# Patient Record
Sex: Female | Born: 1970
Health system: Southern US, Community
[De-identification: ages and names within clinical notes are randomized; demographics above are authoritative.]

## PROBLEM LIST (undated history)

## (undated) DIAGNOSIS — J45909 Unspecified asthma, uncomplicated: Secondary | ICD-10-CM

## (undated) DIAGNOSIS — N83209 Unspecified ovarian cyst, unspecified side: Secondary | ICD-10-CM

## (undated) DIAGNOSIS — M1990 Unspecified osteoarthritis, unspecified site: Secondary | ICD-10-CM

## (undated) DIAGNOSIS — N939 Abnormal uterine and vaginal bleeding, unspecified: Secondary | ICD-10-CM

## (undated) HISTORY — PX: HERNIA REPAIR: SHX51

## (undated) HISTORY — PX: OVARIAN CYST REMOVAL: SHX89

## (undated) HISTORY — PX: HAND SURGERY: SHX662

---

## 2003-09-05 ENCOUNTER — Other Ambulatory Visit: Admission: RE | Admit: 2003-09-05 | Discharge: 2003-09-05 | Payer: Self-pay | Admitting: Family Medicine

## 2005-05-16 ENCOUNTER — Other Ambulatory Visit: Admission: RE | Admit: 2005-05-16 | Discharge: 2005-05-16 | Payer: Self-pay | Admitting: Obstetrics and Gynecology

## 2007-03-08 ENCOUNTER — Encounter: Admission: RE | Admit: 2007-03-08 | Discharge: 2007-03-08 | Payer: Self-pay | Admitting: Emergency Medicine

## 2009-10-05 ENCOUNTER — Other Ambulatory Visit: Admission: RE | Admit: 2009-10-05 | Discharge: 2009-10-05 | Payer: Self-pay | Admitting: Family Medicine

## 2010-07-08 ENCOUNTER — Other Ambulatory Visit: Payer: Self-pay | Admitting: Family Medicine

## 2010-07-08 ENCOUNTER — Ambulatory Visit
Admission: RE | Admit: 2010-07-08 | Discharge: 2010-07-08 | Disposition: A | Discharge status: Home / Self Care | Payer: Self-pay | Source: Ambulatory Visit | Attending: Family Medicine | Admitting: Family Medicine

## 2010-07-08 DIAGNOSIS — R509 Fever, unspecified: Secondary | ICD-10-CM

## 2010-07-08 DIAGNOSIS — R0789 Other chest pain: Secondary | ICD-10-CM

## 2010-07-08 DIAGNOSIS — Z87891 Personal history of nicotine dependence: Secondary | ICD-10-CM

## 2010-07-09 ENCOUNTER — Ambulatory Visit
Admission: RE | Admit: 2010-07-09 | Discharge: 2010-07-09 | Disposition: A | Discharge status: Home / Self Care | Payer: BC Managed Care – PPO | Source: Ambulatory Visit | Attending: Family Medicine | Admitting: Family Medicine

## 2010-07-09 ENCOUNTER — Other Ambulatory Visit: Payer: Self-pay | Admitting: Family Medicine

## 2010-07-09 DIAGNOSIS — R1032 Left lower quadrant pain: Secondary | ICD-10-CM

## 2010-07-09 DIAGNOSIS — R509 Fever, unspecified: Secondary | ICD-10-CM

## 2010-07-09 MED ORDER — IOHEXOL 300 MG/ML  SOLN
100.0000 mL | Freq: Once | INTRAMUSCULAR | Status: AC | PRN
  Administered 2010-07-09: 100 mL via INTRAVENOUS

## 2010-07-25 ENCOUNTER — Other Ambulatory Visit: Payer: Self-pay | Admitting: Obstetrics and Gynecology

## 2010-09-03 ENCOUNTER — Encounter (HOSPITAL_COMMUNITY)
Admission: RE | Admit: 2010-09-03 | Discharge: 2010-09-03 | Disposition: A | Discharge status: Home / Self Care | Payer: BC Managed Care – PPO | Source: Ambulatory Visit | Attending: Obstetrics and Gynecology | Admitting: Obstetrics and Gynecology

## 2010-09-03 LAB — CBC
HCT: 36.3 % (ref 36.0–46.0)
Hemoglobin: 11.9 g/dL — ABNORMAL LOW (ref 12.0–15.0)
RBC: 4.28 MIL/uL (ref 3.87–5.11)
RDW: 14.2 % (ref 11.5–15.5)
WBC: 7.8 10*3/uL (ref 4.0–10.5)

## 2010-09-03 LAB — URINALYSIS, ROUTINE W REFLEX MICROSCOPIC
Bilirubin Urine: NEGATIVE
Glucose, UA: NEGATIVE mg/dL
Hgb urine dipstick: NEGATIVE
Ketones, ur: NEGATIVE mg/dL
Nitrite: NEGATIVE
Urobilinogen, UA: 0.2 mg/dL (ref 0.0–1.0)

## 2010-09-03 LAB — SURGICAL PCR SCREEN: Staphylococcus aureus: INVALID — AB

## 2010-09-03 LAB — BASIC METABOLIC PANEL: Potassium: 3.7 mEq/L (ref 3.5–5.1)

## 2010-09-06 LAB — MRSA CULTURE

## 2010-09-10 ENCOUNTER — Ambulatory Visit (HOSPITAL_COMMUNITY)
Admission: RE | Admit: 2010-09-10 | Discharge: 2010-09-10 | Disposition: A | Discharge status: Home / Self Care | Payer: BC Managed Care – PPO | Source: Ambulatory Visit | Attending: Obstetrics and Gynecology | Admitting: Obstetrics and Gynecology

## 2010-09-10 ENCOUNTER — Other Ambulatory Visit: Payer: Self-pay | Admitting: Obstetrics and Gynecology

## 2010-09-10 DIAGNOSIS — N840 Polyp of corpus uteri: Secondary | ICD-10-CM | POA: Insufficient documentation

## 2010-09-10 DIAGNOSIS — R1032 Left lower quadrant pain: Secondary | ICD-10-CM | POA: Insufficient documentation

## 2010-09-10 DIAGNOSIS — N831 Corpus luteum cyst of ovary, unspecified side: Secondary | ICD-10-CM | POA: Insufficient documentation

## 2010-09-10 DIAGNOSIS — D279 Benign neoplasm of unspecified ovary: Secondary | ICD-10-CM | POA: Insufficient documentation

## 2010-09-10 LAB — PREGNANCY, URINE: Preg Test, Ur: NEGATIVE

## 2010-09-19 NOTE — Op Note (Signed)
NAME:  Allison Knox, Allison Knox NO.:  [id number]  MEDICAL RECORD NO.:  [id number]           PATIENT TYPE:  O  LOCATION:  [facility]                          [facility]AC[facility]  [facility]  PHYSICIAN:  Randye Lobo, M.D.   DATE OF BIRTH:  19-Jul-1970  DATE OF PROCEDURE:  09/10/2010 DATE OF DISCHARGE:                              OPERATIVE REPORT   PREOPERATIVE DIAGNOSES: 1. Left ovarian dermoid cyst. 2. Complex right ovarian cyst. 3. Suspected endometrial polyp.  POSTOPERATIVE DIAGNOSES: 1. Left ovarian dermoid cyst. 2. Right corpus luteum cyst. 3. Endometrial polyp.  PROCEDURES:  Hysteroscopic polypectomy, dilation and curettage, laparoscopic left salpingo-oophorectomy.  SURGEON:  Randye Lobo, MD  ASSISTANT:  Miguel Aschoff, MD  ANESTHESIA:  General endotracheal, local with 0.25% Marcaine.  IV FLUIDS:  1000 mL Ringer's lactate.  ESTIMATED BLOOD LOSS:  Last than 100 mL.  URINE OUTPUT:  300 mL.  COMPLICATIONS:  None.  INDICATIONS FOR PROCEDURE:  The patient is a 40 year old para 2 Caucasian female who presented to her primary care provider with fever and left lower quadrant pain.  A CT scan on July 09, 2010, demonstrated a 4.5 x 5.5-cm left ovarian dermoid cyst.  Ultrasound in the office on July 25, 2010, documented a 5.6-cm cyst consistent with a dermoid.  There was a 2.4-cm complex right ovarian cyst and there was also a 1.6-cm echogenic endometrial focus consistent with a possible polyp.  The patient had been having regular menstruation, but with lingering bleeding.  A plan was made to proceed with a hysteroscopic polypectomy, dilation and curettage, and a laparoscopy with possible bilateral ovarian cystectomies and possible left salpingo-oophorectomy. Risks, benefits, and alternatives were reviewed with the patient who wished to proceed.  FINDINGS:  Hysteroscopy demonstrated a 1.5-cm endometrial polyps sitting in the right lower uterine segment and extending from  the lateral wall of the uterus.  Both tubal ostial regions were normal.  There was no evidence of any submucosal fibroids.  Laparoscopy demonstrated a 6-cm left ovarian dermoid cyst.  The surface of the ovary was noted to be smooth.  The right ovary contained a 1-cm corpus luteum cyst and the fallopian tubes and uterus were normal.  The appendix and liver were unremarkable as well.  There appeared to be some small congenital adhesions of the left sigmoid colon to the left pelvic sidewall.  There was no evidence of any endometriosis in the abdomen or in the pelvis.  There was no evidence of adhesive disease.  All peritoneal surfaces were free of papillations and excrescences.  SPECIMENS:  Three specimens were sent to pathology separately.  The endometrial polyp, endometrial curettings, and the left tube and ovary were sent as three separate specimens.  PROCEDURE:  The patient was reidentified in the preoperative hold area. She received clindamycin 900 mg IV for antibiotic prophylaxis and she received PAS stockings for DVT prophylaxis.  The patient was escorted to the operating room, where she was placed in the supine position on the operating room table.  General endotracheal anesthesia was induced and she was then placed in the dorsal lithotomy position.  The abdomen, vagina  and perineum were then sterilely prepped and draped.  A Foley catheter was sterilely placed inside the bladder.  An exam under anesthesia was performed.  There was a fullness in the left adnexal region.  The uterus was small and anteverted.  A speculum was placed inside the vagina and a single-tooth tenaculum was placed on the anterior cervical lip.  The uterus was sounded to 8-cm. The cervix was then dilated to a #23 Pratt dilator.  The diagnostic hysteroscope was then inserted under the continuous infusion of glycine. The findings are as noted above.  The cervix was then further dilated to #25 [facility] dilator.   A polyp forceps was used to extract the polyp which was sent to pathology.  All four quadrants of the endometrium were then curetted with a sharp and serrated curette.  The endometrial curettings were sent to pathology.  The hysteroscope was inserted by final time to confirm removal of the polyp.  The single-tooth tenaculum was then converted to a Hulka tenaculum.  The laparoscopy was performed at this time.  An Allis clamp was used to elevate the umbilicus and a 1-cm vertical umbilical incision was created with a scalpel on the lower portion of the umbilicus.  An Allis clamp was used to dissect down to the fascia.  A 10-mm trocar was inserted directly into the peritoneal cavity.  The laparoscope confirmed proper placement.  A CO2 pneumoperitoneum was achieved.  The patient was placed in the Trendelenburg position.  The abdominal wall was transilluminated and 5-mm incisions were created in the right and left lower quadrants.  A 5-mm trocars were placed under visualization of the laparoscope.  An inspection of the pelvic and abdominal organs was performed and the findings are as noted above.  The procedure began by grasping the left ovary and the uterine serosa was then scored with a monopolar cautery scissors.  Dissection began to peel the ovarian cortex away from the underlying dermoid cyst.  The ovarian  cortex was very adherent to the cyst wall.  It was also very thin and  attenuated in regions where it was possible to separate.  In addition, the  dermoid cyst was quite large and heavy and a decision was made to proceed  with a left  salpingo-oophorectomy after the right ovary was confirmed to be normal.  The ovary was then elevated in order to gain access to the left infundibulopelvic ligament.  The infundibulopelvic ligament was grasped immediately adjacent to the ovary.  It was triply cauterized with the gyrus instrument and then cut.  Dissection continued through the  left broad ligament using the same instrument for cautery and cutting.  The proximal fallopian tube was similarly cauterized and cut and then the left utero-ovarian ligament was grasped, cauterized with the bipolar energy and then bisected with the gyrus instrument.  The specimen was placed in the cul-de-sac.  The pedicles were then recauterized carefully using the gyrus instrument after the ureter was noted to be well away from the surgical field.  Hemostasis was good at this time.  The left lower quadrant incision was enlarged along the skin and 10/11 mm trocar was then placed under visualization of the laparoscope.  An EndoBag was inserted into the peritoneal cavity and the specimen was placed inside the bag.  The specimen was then drawn up and out through the left lower quadrant incision which was extended slightly along the fascia.  The cyst was drained and there was clear yellow liquid in addition to some  sebaceous material noted.  The left tube and ovary were then sent to pathology.  The left lower quadrant trocar was replaced.  The peritoneal cavity was copiously irrigated and suctioned.  The operative site was noted to be hemostatic.  The left lower quadrant trocar was removed and the Endofascial closure was used to place a single through-and-through suture of 0 Vicryl under the direct visualization of the laparoscope. The suture was tied and there was good closure of the fascia.  The right lower quadrant trocar was removed under visualization of the laparoscope and the pneumoperitoneum was released and the umbilical trocar and laparoscope were removed.  The skin incisions were all closed with subcuticular sutures of 3-0 plain gut suture.  The incisions were injected locally with 0.25% Marcaine and Dermabond was placed over the incisions.  The Foley catheter and the Hulka tenaculum were removed.  The patient was awakened, extubated and escorted to the recovery room  in stable condition.  There were no complications to the procedure.  All needle, instrument and sponge counts were correct.     Randye Lobo, M.D.     BES/MEDQ  D:  09/10/2010  T:  09/11/2010  Job:  [id number]  Electronically Signed by Conley Simmonds M.D. on 09/19/2010 06:46:43 AM

## 2013-08-16 ENCOUNTER — Encounter (HOSPITAL_COMMUNITY): Payer: Self-pay | Admitting: Obstetrics and Gynecology

## 2013-08-16 ENCOUNTER — Inpatient Hospital Stay (HOSPITAL_COMMUNITY)
Admission: AD | Admit: 2013-08-16 | Discharge: 2013-08-16 | Disposition: A | Discharge status: Home / Self Care | Payer: BC Managed Care – PPO | Source: Ambulatory Visit | Attending: Obstetrics and Gynecology | Admitting: Obstetrics and Gynecology

## 2013-08-16 DIAGNOSIS — R109 Unspecified abdominal pain: Secondary | ICD-10-CM | POA: Insufficient documentation

## 2013-08-16 DIAGNOSIS — J45909 Unspecified asthma, uncomplicated: Secondary | ICD-10-CM | POA: Insufficient documentation

## 2013-08-16 DIAGNOSIS — N949 Unspecified condition associated with female genital organs and menstrual cycle: Secondary | ICD-10-CM

## 2013-08-16 DIAGNOSIS — N920 Excessive and frequent menstruation with regular cycle: Secondary | ICD-10-CM | POA: Insufficient documentation

## 2013-08-16 DIAGNOSIS — N938 Other specified abnormal uterine and vaginal bleeding: Secondary | ICD-10-CM

## 2013-08-16 DIAGNOSIS — Z87891 Personal history of nicotine dependence: Secondary | ICD-10-CM | POA: Insufficient documentation

## 2013-08-16 DIAGNOSIS — Z8742 Personal history of other diseases of the female genital tract: Secondary | ICD-10-CM

## 2013-08-16 HISTORY — DX: Abnormal uterine and vaginal bleeding, unspecified: N93.9

## 2013-08-16 HISTORY — DX: Unspecified asthma, uncomplicated: J45.909

## 2013-08-16 HISTORY — DX: Unspecified ovarian cyst, unspecified side: N83.209

## 2013-08-16 LAB — CBC
HEMATOCRIT: 35.4 % — AB (ref 36.0–46.0)
Hemoglobin: 12 g/dL (ref 12.0–15.0)
MCH: 28.8 pg (ref 26.0–34.0)
MCHC: 33.9 g/dL (ref 30.0–36.0)
MCV: 85.1 fL (ref 78.0–100.0)
Platelets: 295 10*3/uL (ref 150–400)
RBC: 4.16 MIL/uL (ref 3.87–5.11)
RDW: 14.4 % (ref 11.5–15.5)
WBC: 14.8 10*3/uL — AB (ref 4.0–10.5)

## 2013-08-16 LAB — URINALYSIS, ROUTINE W REFLEX MICROSCOPIC
BILIRUBIN URINE: NEGATIVE
Glucose, UA: NEGATIVE mg/dL
KETONES UR: NEGATIVE mg/dL
Leukocytes, UA: NEGATIVE
Nitrite: NEGATIVE
PROTEIN: NEGATIVE mg/dL
SPECIFIC GRAVITY, URINE: 1.015 (ref 1.005–1.030)
Urobilinogen, UA: 0.2 mg/dL (ref 0.0–1.0)
pH: 6 (ref 5.0–8.0)

## 2013-08-16 LAB — URINE MICROSCOPIC-ADD ON

## 2013-08-16 LAB — POCT PREGNANCY, URINE: PREG TEST UR: NEGATIVE

## 2013-08-16 MED ORDER — MEGESTROL ACETATE 40 MG PO TABS
40.0000 mg | ORAL_TABLET | Freq: Every day | ORAL | Status: DC
Start: 2013-08-16 — End: 2015-12-08

## 2013-08-16 MED ORDER — KETOROLAC TROMETHAMINE 30 MG/ML IJ SOLN
30.0000 mg | Freq: Once | INTRAMUSCULAR | Status: AC
  Administered 2013-08-16: 30 mg via INTRAMUSCULAR
  Filled 2013-08-16: qty 1

## 2013-08-16 MED ORDER — IBUPROFEN 600 MG PO TABS: 600.0000 mg | ORAL_TABLET | Freq: Four times a day (QID) | ORAL | Status: DC | PRN

## 2013-08-16 NOTE — MAU Provider Note (Signed)
History     CSN: [id number]  Arrival date and time: 08/16/13 1449   First Provider Initiated Contact with Patient 08/16/13 1713      Chief Complaint  Patient presents with  . Vaginal Bleeding  . Abdominal Pain   HPI  Ms. Allison Knox is a 43 y.o. female who presents to MAU with irregular bleeding. She started her menstrual  ycle on Feb. 10; bled for 4 weeks. Off and on the bleeding went from light to heavy with clotting. She stopped bleeding for 1 week and started again this past Sunday. Today she feels like the bleeding is very heavy, along with lower abdominal discomfort. She took 3 Advil this morning; last dose of Advil was 11:00. She rates her pain 1-2. She also feels like she is "spacie" and more hungry.   OB History   No data available      No past medical history on file.  No past surgical history on file.  No family history on file.  History  Substance Use Topics  . Smoking status: Not on file  . Smokeless tobacco: Not on file  . Alcohol Use: Not on file    Allergies: Allergies not on file  No prescriptions prior to admission   Results for orders placed during the hospital encounter of 08/16/13 (from the past 48 hour(s))  CBC     Status: Abnormal   Collection Time    08/16/13  3:36 PM      Result Value Ref Range   WBC 14.8 (*) 4.0 - 10.5 K/uL   RBC 4.16  3.87 - 5.11 MIL/uL   Hemoglobin 12.0  12.0 - 15.0 g/dL   HCT 35.4 (*) 36.0 - 46.0 %   MCV 85.1  78.0 - 100.0 fL   MCH 28.8  26.0 - 34.0 pg   MCHC 33.9  30.0 - 36.0 g/dL   RDW 14.4  11.5 - 15.5 %   Platelets 295  150 - 400 K/uL  POCT PREGNANCY, URINE     Status: None   Collection Time    08/16/13  3:38 PM      Result Value Ref Range   Preg Test, Ur NEGATIVE  NEGATIVE   Comment:            THE SENSITIVITY OF THIS     METHODOLOGY IS >24 mIU/mL  URINALYSIS, ROUTINE W REFLEX MICROSCOPIC     Status: Abnormal   Collection Time    08/16/13  3:39 PM      Result Value Ref Range   Color, Urine YELLOW   YELLOW   APPearance CLEAR  CLEAR   Specific Gravity, Urine 1.015  1.005 - 1.030   pH 6.0  5.0 - 8.0   Glucose, UA NEGATIVE  NEGATIVE mg/dL   Hgb urine dipstick LARGE (*) NEGATIVE   Bilirubin Urine NEGATIVE  NEGATIVE   Ketones, ur NEGATIVE  NEGATIVE mg/dL   Protein, ur NEGATIVE  NEGATIVE mg/dL   Urobilinogen, UA 0.2  0.0 - 1.0 mg/dL   Nitrite NEGATIVE  NEGATIVE   Leukocytes, UA NEGATIVE  NEGATIVE  URINE MICROSCOPIC-ADD ON     Status: Abnormal   Collection Time    08/16/13  3:39 PM      Result Value Ref Range   Squamous Epithelial / LPF RARE  RARE   WBC, UA 3-6  <3 WBC/hpf   RBC / HPF TOO NUMEROUS TO COUNT  <3 RBC/hpf   Bacteria, UA FEW (*) RARE  Review of Systems  Constitutional: Positive for malaise/fatigue. Negative for fever and chills.  Eyes: Negative for blurred vision.  Gastrointestinal: Positive for abdominal pain (Bilateral lower abdominal pain; at times worse on the right side. ). Negative for nausea and vomiting.  Neurological: Negative for dizziness and weakness.   Physical Exam   Blood pressure 108/74, pulse 67, temperature 98.4 F (36.9 C), temperature source Oral, resp. rate 16, height 5\' 6"  (1.676 m), weight 75.932 kg (167 lb 6.4 oz), last menstrual period 07/12/2013, SpO2 100.00%.  Physical Exam  Constitutional: She is oriented to person, place, and time. She appears well-developed and well-nourished. No distress.  HENT:  Head: Normocephalic.  Eyes: Pupils are equal, round, and reactive to light.  Neck: Neck supple.  GI: Soft. She exhibits no distension. There is no tenderness. There is no rebound and no guarding.  Genitourinary:  Speculum exam: Vagina - Moderate amount of dark red blood in the vaginal canal  Cervix - Moderate active bleeding from cervix  Bimanual exam: Cervix closed, non tender  Uterus non tender, slightly enlarged  Adnexa non tender, no masses bilaterally Chaperone present for exam.   Musculoskeletal: Normal range of motion.   Neurological: She is alert and oriented to person, place, and time.  Skin: Skin is warm. She is not diaphoretic.  Psychiatric: Her behavior is normal.    MAU Course  Procedures None  MDM 1740: Dr. Rogue Bussing at bedside CBC UA Toradol 30 mg IM     Assessment and Plan   A:  1. History of heavy vaginal bleeding    P:  Discharge home in stable condition RX: Megace  Pt to follow up in the office this week per Dr. Rogue Bussing; call to schedule Bleeding precautions Return to MAU as needed, if symptoms worsen.   Allison Hillock Rasch, NP 08/16/2013, 8:32 PM

## 2013-08-16 NOTE — MAU Note (Signed)
Patient states she started her period on 2-10 and bled for about 4 weeks, stopped for one week then started again on 3-15. States her periods have been regular before. States she has felt fatigue and not able to sleep well. States she started having heavier bleeding and passing clots since yesterday. Had had some clotting and slightly heavier bleeding during the first four weeks of bleeding. Denies nausea or vomiting.

## 2013-08-16 NOTE — H&P (Signed)
43 y.o. yo complains of one month of bleeding.  Typically she has normal monthly periods but she bled the whole month of February.  Bleeding stopped for one week and resumed again this past Sunday, sometimes heavy, sometimes passing clots.  Past Medical History  Diagnosis Date  . Asthma     seasonal  . Abnormal vaginal bleeding   . Ovarian cyst    Past Surgical History  Procedure Laterality Date  . Hernia repair    . Ovarian cyst removal    . Hand surgery      History   Social History  . Marital Status: Married    Spouse Name: N/A    Number of Children: N/A  . Years of Education: N/A   Occupational History  . Not on file.   Social History Main Topics  . Smoking status: Former Research scientist (life sciences)  . Smokeless tobacco: Never Used  . Alcohol Use: No  . Drug Use: No  . Sexual Activity: Not on file   Other Topics Concern  . Not on file   Social History Narrative  . No narrative on file    No current facility-administered medications on file prior to encounter.   No current outpatient prescriptions on file prior to encounter.    Allergies  Allergen Reactions  . Penicillins Anaphylaxis  . Codeine     Makes her "hyper"    @VITALS2 @ vital normal and stable.  Hb 12.0  Lungs: clear to ascultation Cor:  RRR Abdomen:  soft, nontender, nondistended. Ex:  no cords, erythema Pelvic:  Min to mod blood per NP Anderson Malta  A:  Menorrhagia   P: Will do Megace taper 40mg  tid x 3d, 40mg  bid x 3d, 40mg  qd until bleeding stops or #65 run out. She will schedule appt with me in the office for EMB and [place]. Allyn Kenner

## 2013-08-23 NOTE — MAU Provider Note (Signed)
See my own H&P

## 2013-08-30 ENCOUNTER — Other Ambulatory Visit: Payer: Self-pay

## 2014-04-03 ENCOUNTER — Encounter (HOSPITAL_COMMUNITY): Payer: Self-pay | Admitting: Obstetrics and Gynecology

## 2015-01-26 ENCOUNTER — Ambulatory Visit: Payer: Self-pay | Admitting: Podiatry

## 2015-01-29 ENCOUNTER — Ambulatory Visit (INDEPENDENT_AMBULATORY_CARE_PROVIDER_SITE_OTHER): Payer: Managed Care, Other (non HMO) | Admitting: Podiatry

## 2015-01-29 ENCOUNTER — Ambulatory Visit (INDEPENDENT_AMBULATORY_CARE_PROVIDER_SITE_OTHER): Payer: Managed Care, Other (non HMO)

## 2015-01-29 ENCOUNTER — Encounter: Payer: Self-pay | Admitting: Podiatry

## 2015-01-29 VITALS — BP 110/71 | HR 68 | Resp 16

## 2015-01-29 DIAGNOSIS — M79671 Pain in right foot: Secondary | ICD-10-CM | POA: Diagnosis not present

## 2015-01-29 DIAGNOSIS — M7731 Calcaneal spur, right foot: Secondary | ICD-10-CM | POA: Diagnosis not present

## 2015-01-29 DIAGNOSIS — M6740 Ganglion, unspecified site: Secondary | ICD-10-CM | POA: Diagnosis not present

## 2015-01-29 NOTE — Progress Notes (Signed)
Subjective:     Patient ID: Allison Knox, female   DOB: 01-Oct-1970, 44 y.o.   MRN: [id number]  HPI patient states I have pain on top of my foot where I have this lesion and it sometimes is bigger and sometimes smaller and it can become discomforting. Patient's not sure as to what may be causing this and states it's been present for a year to a year and a half   Review of Systems  All other systems reviewed and are negative.      Objective:   Physical Exam  Constitutional: She is oriented to person, place, and time.  Cardiovascular: Intact distal pulses.   Musculoskeletal: Normal range of motion.  Neurological: She is oriented to person, place, and time.  Skin: Skin is warm.  Nursing note and vitals reviewed.  neurovascular status intact with muscle strength adequate range of motion within normal limits. Patient's noted to have good digital perfusion is well oriented 3 and on the first metatarsal just proximal to the head there is an approximate 5 x 5 mm lesion which is within subcutaneous tissue and appears to be freely movable. It is nontender with no color changes noted at the current time     Assessment:     Probable ganglionic cyst of the dorsal right first metatarsal    Plan:     H&P and x-rays reviewed and proximal nerve block administered. I then went ahead and I aspirated the area and was unable to get out any kind of gelatinous fluid due to the small size but I did inject with a small amount of sterilely to try to reduce inflammation and compressed. I gave instructions on wider shoes and reappoint

## 2015-01-29 NOTE — Progress Notes (Signed)
   Subjective:    Patient ID: Allison Knox, female    DOB: 10/29/1970, 44 y.o.   MRN: [id number]  HPI Pt presents c/o swelling/cyst type knot at her right MPJ, comes and goes. It is painful at times   Review of Systems  Respiratory: Positive for wheezing.   Psychiatric/Behavioral: The patient is nervous/anxious.   All other systems reviewed and are negative.      Objective:   Physical Exam        Assessment & Plan:

## 2015-12-08 ENCOUNTER — Encounter (HOSPITAL_COMMUNITY): Payer: Self-pay | Admitting: *Deleted

## 2015-12-08 ENCOUNTER — Emergency Department (HOSPITAL_COMMUNITY)
Admission: EM | Admit: 2015-12-08 | Discharge: 2015-12-08 | Disposition: A | Discharge status: Home / Self Care | Payer: BLUE CROSS/BLUE SHIELD | Attending: Emergency Medicine | Admitting: Emergency Medicine

## 2015-12-08 DIAGNOSIS — M7989 Other specified soft tissue disorders: Secondary | ICD-10-CM | POA: Diagnosis not present

## 2015-12-08 DIAGNOSIS — M79604 Pain in right leg: Secondary | ICD-10-CM | POA: Insufficient documentation

## 2015-12-08 DIAGNOSIS — J45909 Unspecified asthma, uncomplicated: Secondary | ICD-10-CM | POA: Insufficient documentation

## 2015-12-08 DIAGNOSIS — Z87891 Personal history of nicotine dependence: Secondary | ICD-10-CM | POA: Diagnosis not present

## 2015-12-08 DIAGNOSIS — Z79899 Other long term (current) drug therapy: Secondary | ICD-10-CM | POA: Insufficient documentation

## 2015-12-08 DIAGNOSIS — Z791 Long term (current) use of non-steroidal anti-inflammatories (NSAID): Secondary | ICD-10-CM | POA: Insufficient documentation

## 2015-12-08 MED ORDER — ENOXAPARIN SODIUM 80 MG/0.8ML ~~LOC~~ SOLN
1.0000 mg/kg | Freq: Once | SUBCUTANEOUS | Status: AC
  Administered 2015-12-08: 75 mg via SUBCUTANEOUS
  Filled 2015-12-08: qty 0.8

## 2015-12-08 NOTE — ED Notes (Signed)
Pt alert & oriented x4, stable gait. Patient given discharge instructions, paperwork & prescription(s). Patient instructed to stop at the registration desk to finish any additional paperwork. Patient verbalized understanding. Pt left department in wheelchair. Pt left w/ no further questions.

## 2015-12-08 NOTE — ED Provider Notes (Signed)
CSN: [id number]     Arrival date & time 12/08/15  1850 History   First MD Initiated Contact with Patient 12/08/15 1907     Chief Complaint  Patient presents with  . Leg Pain     (Consider location/radiation/quality/duration/timing/severity/associated sxs/prior Treatment) Patient is a 45 y.o. female presenting with leg pain.  Leg Pain Location:  Leg Leg location:  R lower leg Pain details:    Quality:  Aching and pressure   Radiates to:  Does not radiate   Severity:  Mild Chronicity:  New Dislocation: no   Relieved by:  None tried Worsened by:  Nothing tried Ineffective treatments:  None tried Associated symptoms: no back pain, no itching and no muscle weakness     Past Medical History  Diagnosis Date  . Asthma     seasonal  . Abnormal vaginal bleeding   . Ovarian cyst    Past Surgical History  Procedure Laterality Date  . Hernia repair    . Ovarian cyst removal    . Hand surgery     No family history on file. Social History  Substance Use Topics  . Smoking status: Former Research scientist (life sciences)  . Smokeless tobacco: Never Used  . Alcohol Use: No   OB History    Gravida Para Term Preterm AB TAB SAB Ectopic Multiple Living   2 2 2       2      Review of Systems  Respiratory: Negative for cough, chest tightness and shortness of breath.   Cardiovascular: Positive for leg swelling. Negative for chest pain.  Musculoskeletal: Negative for back pain.  Skin: Negative for itching.  All other systems reviewed and are negative.     Allergies  Penicillins and Codeine  Home Medications   Prior to Admission medications   Medication Sig Start Date End Date Taking? Authorizing Provider  ibuprofen (ADVIL,MOTRIN) 200 MG tablet Take 600 mg by mouth every 6 (six) hours as needed for mild pain or moderate pain.   Yes Historical Provider, MD  loratadine (CLARITIN) 10 MG tablet Take 10 mg by mouth every morning.   Yes Historical Provider, MD   BP 142/82 mmHg  Pulse 81  Temp(Src) 98.3 F  (36.8 C) (Oral)  Resp 16  Ht 5\' 7"  (1.702 m)  Wt 170 lb (77.111 kg)  BMI 26.62 kg/m2  SpO2 100%  LMP 11/22/2015 Physical Exam  Constitutional: She is oriented to person, place, and time. She appears well-developed and well-nourished.  HENT:  Head: Normocephalic and atraumatic.  Neck: Normal range of motion.  Cardiovascular: Normal rate and regular rhythm.   Pulmonary/Chest: Effort normal. No stridor. No respiratory distress. She has no wheezes.  Abdominal: Soft. She exhibits no distension.  Musculoskeletal: Normal range of motion. She exhibits edema (right leg with swelling below knee, slight pitting around ankle). She exhibits no tenderness.  Neurological: She is alert and oriented to person, place, and time.  Skin: Skin is warm and dry.  Nursing note and vitals reviewed.   ED Course  Procedures (including critical care time) Labs Review Labs Reviewed - No data to display  Imaging Review No results found. I have personally reviewed and evaluated these images and lab results as part of my medical decision-making.   EKG Interpretation None      MDM   Final diagnoses:  Pain of right lower extremity  Leg swelling    Possibly ruptured bakers cyst, but had recent car ride, will give lovenox and have her schedule an [place] to  eval for DVT. No e/o for cellulitis or other infectious causes of her symptoms.   New Prescriptions: Discharge Medication List as of 12/08/2015  8:07 PM      I have personally and contemperaneously reviewed labs and imaging and used in my decision making as above.   A medical screening exam was performed and I feel the patient has had an appropriate workup for their chief complaint at this time and likelihood of emergent condition existing is low and thus workup can continue on an outpatient basis.. Their vital signs are stable. They have been counseled on decision, discharge, follow up and which symptoms necessitate immediate return to the emergency  department.  They verbally stated understanding and agreement with plan and discharged in stable condition.      Merrily Pew, MD 12/09/15 [phone]

## 2015-12-08 NOTE — ED Notes (Signed)
Pt states a swelling & tightness to her right lower leg. Started [place] Just [place] home. Pulses present, good cap refill & sensation.

## 2015-12-08 NOTE — ED Notes (Signed)
Pt [place]. Once she got there she began having leg pain on Tuesday. Pt now has swelling, tenderness, and warmth in her left leg calf.

## 2015-12-09 ENCOUNTER — Ambulatory Visit (HOSPITAL_COMMUNITY)
Admission: RE | Admit: 2015-12-09 | Discharge: 2015-12-09 | Disposition: A | Discharge status: Home / Self Care | Payer: BLUE CROSS/BLUE SHIELD | Source: Ambulatory Visit | Attending: Emergency Medicine | Admitting: Emergency Medicine

## 2015-12-09 ENCOUNTER — Ambulatory Visit (HOSPITAL_COMMUNITY): Admission: RE | Admit: 2015-12-09 | Payer: BLUE CROSS/BLUE SHIELD | Source: Ambulatory Visit

## 2015-12-09 ENCOUNTER — Other Ambulatory Visit (HOSPITAL_COMMUNITY): Payer: Self-pay | Admitting: Emergency Medicine

## 2015-12-09 DIAGNOSIS — M79604 Pain in right leg: Secondary | ICD-10-CM

## 2015-12-09 NOTE — ED Provider Notes (Signed)
US venous shows baker's cyst.  No DVT.   Pt given results and advised to follow up with her MD.  Fransico Meadow, PA-C 12/09/15 [place], MD 12/12/15 1753

## 2016-03-24 ENCOUNTER — Ambulatory Visit: Payer: Managed Care, Other (non HMO) | Admitting: Podiatry

## 2016-04-21 ENCOUNTER — Ambulatory Visit: Payer: BLUE CROSS/BLUE SHIELD | Admitting: Podiatry

## 2016-06-20 ENCOUNTER — Encounter: Payer: Self-pay | Admitting: Neurology

## 2016-06-20 ENCOUNTER — Ambulatory Visit (INDEPENDENT_AMBULATORY_CARE_PROVIDER_SITE_OTHER): Payer: BLUE CROSS/BLUE SHIELD | Admitting: Neurology

## 2016-06-20 VITALS — BP 111/70 | HR 62 | Ht 67.0 in | Wt 188.4 lb

## 2016-06-20 DIAGNOSIS — Q049 Congenital malformation of brain, unspecified: Secondary | ICD-10-CM | POA: Diagnosis not present

## 2016-06-20 DIAGNOSIS — G243 Spasmodic torticollis: Secondary | ICD-10-CM

## 2016-06-20 DIAGNOSIS — M5481 Occipital neuralgia: Secondary | ICD-10-CM

## 2016-06-20 DIAGNOSIS — M62838 Other muscle spasm: Secondary | ICD-10-CM

## 2016-06-20 DIAGNOSIS — G930 Cerebral cysts: Secondary | ICD-10-CM

## 2016-06-20 MED ORDER — BACLOFEN 10 MG PO TABS
10.0000 mg | ORAL_TABLET | Freq: Three times a day (TID) | ORAL | 6 refills | Status: DC
Start: 2016-06-20 — End: 2018-04-26

## 2016-06-20 NOTE — Progress Notes (Signed)
[facility]    Provider:  Dr Jaynee Eagles Referring Provider: Melina Schools Primary Care Physician:  Melina Schools  CC:  neck pain  HPI:  Allison Knox is a 46 y.o. female here as a referral from Dr. Stephanie Acre for neck pain.  Husband is here with wife and provides much information. She has had neck pain for the past 2 years without inciting events. Persistent pain and sleep loss. They went to [facility], had physical therapy and steroid injections which helped for a few weeks not much. Slowly progressive. . Last June she started having pain shooting into the back of the head. When she turns to the left or right she feels like it is pulling in the neck. Decreased ROM in neck. She has constant tightness, tension, moderate pain, she does still have intermittent shooting pain from the back of her head to the eyeball. She had prednisone and had major relief from inflammation and chronic pain temporarily, But it wore off. She has significant pain, decreased flexibility. No radicular symptoms. No weakness in the arms, no numbness or tingling. She gets tired of holding her neck up. Physical therapy helped a little but did not last, she had it for 6 weeks. She takes Tramadol which helps. Ice packs help. Deep tissue massage regularly. Has been taking Flexeril which did not help. No other focal neurologic deficits, associated symptoms, inciting events or modifiable factors.  Reviewed notes, labs and imaging from outside physicians, which showed:  Personally reviewed MRI cervical spine images(patient brought a disk with her):Marland Kitchen Multilevel mild to moderate degenerative changes most pronounced at c5-c6 with possibly impingement on c6 nerve roots.  BUN/Creatinine normal  Review of Systems: Patient complains of symptoms per HPI as well as the following symptoms: no CP, no SOB. Pertinent negatives per HPI. All others negative.   Social History   Social History  . Marital status:  Married    Spouse name: N/A  . Number of children: N/A  . Years of education: N/A   Occupational History  . Not on file.   Social History Main Topics  . Smoking status: Former Research scientist (life sciences)  . Smokeless tobacco: Never Used  . Alcohol use No  . Drug use: No  . Sexual activity: Not on file   Other Topics Concern  . Not on file   Social History Narrative   Lives with husband   Caffeine use: Hot tea (black tea) daily   Coffee sometimes    Family History  Problem Relation Age of Onset  . Neuromuscular disorder Neg Hx     Past Medical History:  Diagnosis Date  . Abnormal vaginal bleeding   . Asthma    seasonal  . Ovarian cyst     Past Surgical History:  Procedure Laterality Date  . HAND SURGERY    . HERNIA REPAIR    . OVARIAN CYST REMOVAL      Current Outpatient Prescriptions  Medication Sig Dispense Refill  . cyclobenzaprine (FLEXERIL) 10 MG tablet Take 1 tablet by mouth 2 (two) times daily as needed.    Marland Kitchen ibuprofen (ADVIL,MOTRIN) 200 MG tablet Take 600 mg by mouth every 6 (six) hours as needed for mild pain or moderate pain.    Marland Kitchen loratadine (CLARITIN) 10 MG tablet Take 10 mg by mouth every morning.    . Naproxen-Esomeprazole 500-20 MG TBEC Take 1 tablet by mouth 2 (two) times daily.    . baclofen (LIORESAL) 10 MG tablet Take 1 tablet (10 mg total) by  mouth 3 (three) times daily. 90 each 6   No current facility-administered medications for this visit.     Allergies as of 06/20/2016 - Review Complete 06/20/2016  Allergen Reaction Noted  . Penicillins Anaphylaxis 08/16/2013  . Codeine  08/16/2013    Vitals: BP 111/70 (BP Location: Right Arm, Patient Position: Sitting, Cuff Size: Normal)   Pulse 62   Ht 5\' 7"  (1.702 m)   Wt 188 lb 6.4 oz (85.5 kg)   BMI 29.51 kg/m  Last Weight:  Wt Readings from Last 1 Encounters:  06/20/16 188 lb 6.4 oz (85.5 kg)   Last Height:   Ht Readings from Last 1 Encounters:  06/20/16 5\' 7"  (1.702 m)    Physical exam: Exam: Gen:  NAD, conversant, well nourised, well groomed                     CV: RRR, no MRG. No Carotid Bruits. No peripheral edema, warm, nontender Eyes: Conjunctivae clear without exudates or hemorrhage MSK: Hypertrophied cervical muscles, anterolisthesis with elevated shoulders and left laterocollis  Neuro: Detailed Neurologic Exam  Speech:    Speech is normal; fluent and spontaneous with normal comprehension.  Cognition:    The patient is oriented to person, place, and time;     recent and remote memory intact;     language fluent;     normal attention, concentration,     fund of knowledge Cranial Nerves:    The pupils are equal, round, and reactive to light. The fundi are normal and spontaneous venous pulsations are present. Visual fields are full to finger confrontation. Extraocular movements are intact. Trigeminal sensation is intact and the muscles of mastication are normal. The face is symmetric. The palate elevates in the midline. Hearing intact. Voice is normal. Shoulder shrug is normal. The tongue has normal motion without fasciculations.   Coordination:    Normal finger to nose and heel to shin. Normal rapid alternating movements.   Gait:    Heel-toe and tandem gait are normal.   Motor Observation:    No asymmetry, no atrophy, and no involuntary movements noted. Tone:    Normal muscle tone.    Posture:    Posture is normal. normal erect    Strength:    Strength is V/V in the upper and lower limbs.      Sensation: intact to LT     Reflex Exam:  DTR's:    Deep tendon reflexes in the upper and lower extremities are normal bilaterally.   Toes:    The toes are downgoing bilaterally.   Clonus:    Clonus is absent.      Assessment/Plan:  46 year old with progressive cervical muscle pain and tightness, decreased ROM. They have tried 6 weeks of  physical therapy, regular massage, tramadol pain medication, flexeril muscle relaxer without relief and continued progression. She  also has occipital neuralgia.  MRI brain w/wo contrast: Abnormality seen on MRI cervical spine likely benign cyst or mega cisterna magna but need mri brain to fully visualize as well as evaluate her occipital neuralgia Discussed botox therapy, muscle relaxers, PT, massage Will try Baclofen, they will consider botox therapy for her cervical muscle pain which may be cervical dystonia  Cc: Dr. Sedalia Muta, [facility] [address] [place], [place] [address]  Phone [phone] Fax [phone]

## 2016-06-20 NOTE — Patient Instructions (Addendum)
Remember to drink plenty of fluid, eat healthy meals and do not skip any meals. Try to eat protein with a every meal and eat a healthy snack such as fruit or nuts in between meals. Try to keep a regular sleep-wake schedule and try to exercise daily, particularly in the form of walking, 20-30 minutes a day, if you can.   As far as your medications are concerned, I would like to suggest:Baclofen at night. May try it during the day at half dose initially watch for sedation  As far as diagnostic testing: MRI brain  Botox for Cervical Dystonia  I would like to see you back as needed, sooner if we need to. Please call us with any interim questions, concerns, problems, updates or refill requests.   Our phone number is [phone]. We also have an after hours call service for urgent matters and there is a physician on-call for urgent questions. For any emergencies you know to call 911 or go to the nearest emergency room  Baclofen tablets What is this medicine? BACLOFEN (BAK loe fen) helps relieve spasms and cramping of muscles. It may be used to treat symptoms of multiple sclerosis or spinal cord injury. This medicine may be used for other purposes; ask your health care provider or pharmacist if you have questions. COMMON BRAND NAME(S): ED Baclofen, Lioresal What should I tell my health care provider before I take this medicine? They need to know if you have any of these conditions: -kidney disease -seizures -stroke -an unusual or allergic reaction to baclofen, other medicines, foods, dyes, or preservatives -pregnant or trying to get pregnant -breast-feeding How should I use this medicine? Take this medicine by mouth. Swallow it with a drink of water. Follow the directions on the prescription label. Do not take more medicine than you are told to take. Talk to your pediatrician regarding the use of this medicine in children. Special care may be needed. Overdosage: If you think you have taken too  much of this medicine contact a poison control center or emergency room at once. NOTE: This medicine is only for you. Do not share this medicine with others. What if I miss a dose? If you miss a dose, take it as soon as you can. If it is almost time for your next dose, take only that dose. Do not take double or extra doses. What may interact with this medicine? Do not take this medication with any of the following medicines: -narcotic medicines for cough This medicine may also interact with the following medications: -alcohol -antihistamines for allergy, cough and cold -certain medicines for anxiety or sleep -certain medicines for depression like amitriptyline, fluoxetine, sertraline -certain medicines for seizures like phenobarbital, primidone -general anesthetics like halothane, isoflurane, methoxyflurane, propofol -local anesthetics like lidocaine, pramoxine, tetracaine -medicines that relax muscles for surgery -narcotic medicines for pain -phenothiazines like chlorpromazine, mesoridazine, prochlorperazine, thioridazine This list may not describe all possible interactions. Give your health care provider a list of all the medicines, herbs, non-prescription drugs, or dietary supplements you use. Also tell them if you smoke, drink alcohol, or use illegal drugs. Some items may interact with your medicine. What should I watch for while using this medicine? Tell your doctor or health care professional if your symptoms do not start to get better or if they get worse. Do not suddenly stop taking your medicine. If you do, you may develop a severe reaction. If your doctor wants you to stop the medicine, the dose will be slowly lowered  over time to avoid any side effects. Follow the advice of your doctor. You may get drowsy or dizzy. Do not drive, use machinery, or do anything that needs mental alertness until you know how this medicine affects you. Do not stand or sit up quickly, especially if you are  an older patient. This reduces the risk of dizzy or fainting spells. Alcohol may interfere with the effect of this medicine. Avoid alcoholic drinks. If you are taking another medicine that also causes drowsiness, you may have more side effects. Give your health care provider a list of all medicines you use. Your doctor will tell you how much medicine to take. Do not take more medicine than directed. Call emergency for help if you have problems breathing or unusual sleepiness. What side effects may I notice from receiving this medicine? Side effects that you should report to your doctor or health care professional as soon as possible: -allergic reactions like skin rash, itching or hives, swelling of the face, lips, or tongue -breathing problems -changes in emotions or moods -changes in vision -chest pain -fast, irregular heartbeat -feeling faint or lightheaded, falls -hallucinations -loss of balance or coordination -ringing of the ears -seizures -trouble passing urine or change in the amount of urine -trouble walking -unusually weak or tired Side effects that usually do not require medical attention (report to your doctor or health care professional if they continue or are bothersome): -changes in taste -confusion -constipation -diarrhea -dry mouth -headache -muscle weakness -nausea, vomiting -trouble sleeping This list may not describe all possible side effects. Call your doctor for medical advice about side effects. You may report side effects to FDA at 1-800-FDA-1088. Where should I keep my medicine? Keep out of the reach of children. Store at room temperature between 15 and 30 degrees C (59 and 86 degrees F). Keep container tightly closed. Throw away any unused medicine after the expiration date. NOTE: This sheet is a summary. It may not cover all possible information. If you have questions about this medicine, talk to your doctor, pharmacist, or health care provider.  2017  Elsevier/Gold Standard (2015-02-26 15:56:23)

## 2016-06-22 ENCOUNTER — Encounter: Payer: Self-pay | Admitting: Neurology

## 2016-06-22 DIAGNOSIS — G243 Spasmodic torticollis: Secondary | ICD-10-CM | POA: Insufficient documentation

## 2016-06-25 ENCOUNTER — Telehealth: Payer: Self-pay | Admitting: Neurology

## 2016-06-25 DIAGNOSIS — F419 Anxiety disorder, unspecified: Secondary | ICD-10-CM

## 2016-06-25 NOTE — Telephone Encounter (Signed)
I spoke with the patient and made her MRI appointment at our [facility] mobile unit for 07/09/16

## 2016-06-25 NOTE — Telephone Encounter (Signed)
Patient returning your call.

## 2016-06-25 NOTE — Telephone Encounter (Signed)
The patient is scheduled to have her MRI at our [place] mobile unit on 07/09/16. She informed me that she is claustrophobia and will need something to calm her nerves.

## 2016-06-25 NOTE — Telephone Encounter (Signed)
Allison Knox, can you take care of this? thanks

## 2016-06-26 MED ORDER — ALPRAZOLAM 0.5 MG PO TABS
ORAL_TABLET | ORAL | 0 refills | Status: DC
Start: 2016-06-26 — End: 2018-04-26

## 2016-06-26 NOTE — Telephone Encounter (Signed)
Printed rx xanax. Awaiting SA, MD signature (she is the work in am MD) since Dr Jaynee Eagles is out of the office

## 2016-06-26 NOTE — Telephone Encounter (Signed)
Faxed printed rx xanax to pt pharmacy. Fax: [phone]. Received confirmation.

## 2016-06-26 NOTE — Telephone Encounter (Signed)
Called and LVM for patient advising we called in medication to her pharmacy. Gave instructions on how she should take medication. Asked her to call back if she has any further questions or concerns.

## 2016-07-09 ENCOUNTER — Ambulatory Visit (INDEPENDENT_AMBULATORY_CARE_PROVIDER_SITE_OTHER): Payer: BLUE CROSS/BLUE SHIELD

## 2016-07-09 DIAGNOSIS — M5481 Occipital neuralgia: Secondary | ICD-10-CM | POA: Diagnosis not present

## 2016-07-09 DIAGNOSIS — Q049 Congenital malformation of brain, unspecified: Secondary | ICD-10-CM | POA: Diagnosis not present

## 2016-07-09 DIAGNOSIS — G930 Cerebral cysts: Secondary | ICD-10-CM

## 2016-07-09 MED ORDER — GADOPENTETATE DIMEGLUMINE 469.01 MG/ML IV SOLN: 17.0000 mL | Freq: Once | INTRAVENOUS | Status: DC | PRN

## 2016-07-14 ENCOUNTER — Telehealth: Payer: Self-pay

## 2016-07-14 NOTE — Telephone Encounter (Signed)
Called pt w/ results. May call back w/ additional questions/conerns.

## 2016-07-14 NOTE — Telephone Encounter (Signed)
-----   Message from Melvenia Beam, MD sent at 07/12/2016  8:51 PM EST ----- MRI brain unremarkable for age

## 2016-08-05 ENCOUNTER — Ambulatory Visit: Payer: BLUE CROSS/BLUE SHIELD | Admitting: Neurology

## 2016-08-05 ENCOUNTER — Telehealth: Payer: Self-pay

## 2016-08-05 NOTE — Telephone Encounter (Signed)
Pt no-showed her follow-up appt this am.

## 2016-08-06 ENCOUNTER — Encounter: Payer: Self-pay | Admitting: Neurology

## 2017-08-12 DIAGNOSIS — Z6829 Body mass index (BMI) 29.0-29.9, adult: Secondary | ICD-10-CM | POA: Diagnosis not present

## 2017-08-12 DIAGNOSIS — Z124 Encounter for screening for malignant neoplasm of cervix: Secondary | ICD-10-CM | POA: Diagnosis not present

## 2017-08-12 DIAGNOSIS — Z01419 Encounter for gynecological examination (general) (routine) without abnormal findings: Secondary | ICD-10-CM | POA: Diagnosis not present

## 2017-08-12 DIAGNOSIS — Z1231 Encounter for screening mammogram for malignant neoplasm of breast: Secondary | ICD-10-CM | POA: Diagnosis not present

## 2017-08-12 DIAGNOSIS — N926 Irregular menstruation, unspecified: Secondary | ICD-10-CM | POA: Diagnosis not present

## 2017-08-12 DIAGNOSIS — R3915 Urgency of urination: Secondary | ICD-10-CM | POA: Diagnosis not present

## 2017-08-14 DIAGNOSIS — E559 Vitamin D deficiency, unspecified: Secondary | ICD-10-CM | POA: Diagnosis not present

## 2017-08-25 DIAGNOSIS — Z6829 Body mass index (BMI) 29.0-29.9, adult: Secondary | ICD-10-CM | POA: Diagnosis not present

## 2017-08-25 DIAGNOSIS — Z3202 Encounter for pregnancy test, result negative: Secondary | ICD-10-CM | POA: Diagnosis not present

## 2017-08-25 DIAGNOSIS — N920 Excessive and frequent menstruation with regular cycle: Secondary | ICD-10-CM | POA: Diagnosis not present

## 2017-11-17 DIAGNOSIS — N920 Excessive and frequent menstruation with regular cycle: Secondary | ICD-10-CM | POA: Diagnosis not present

## 2017-11-17 DIAGNOSIS — Z3202 Encounter for pregnancy test, result negative: Secondary | ICD-10-CM | POA: Diagnosis not present

## 2017-11-17 DIAGNOSIS — N840 Polyp of corpus uteri: Secondary | ICD-10-CM | POA: Diagnosis not present

## 2017-12-30 DIAGNOSIS — H04123 Dry eye syndrome of bilateral lacrimal glands: Secondary | ICD-10-CM | POA: Diagnosis not present

## 2018-01-05 DIAGNOSIS — H6092 Unspecified otitis externa, left ear: Secondary | ICD-10-CM | POA: Diagnosis not present

## 2018-01-05 DIAGNOSIS — M26622 Arthralgia of left temporomandibular joint: Secondary | ICD-10-CM | POA: Diagnosis not present

## 2018-03-19 DIAGNOSIS — L03019 Cellulitis of unspecified finger: Secondary | ICD-10-CM | POA: Diagnosis not present

## 2018-04-22 DIAGNOSIS — L309 Dermatitis, unspecified: Secondary | ICD-10-CM | POA: Diagnosis not present

## 2018-04-26 ENCOUNTER — Encounter (HOSPITAL_COMMUNITY): Payer: Self-pay | Admitting: Emergency Medicine

## 2018-04-26 ENCOUNTER — Emergency Department (HOSPITAL_COMMUNITY): Payer: BLUE CROSS/BLUE SHIELD

## 2018-04-26 ENCOUNTER — Observation Stay (HOSPITAL_COMMUNITY)
Admission: EM | Admit: 2018-04-26 | Discharge: 2018-04-27 | Disposition: A | Discharge status: Home / Self Care | Payer: BLUE CROSS/BLUE SHIELD | Attending: Family Medicine | Admitting: Family Medicine

## 2018-04-26 ENCOUNTER — Other Ambulatory Visit: Payer: Self-pay

## 2018-04-26 DIAGNOSIS — R1010 Upper abdominal pain, unspecified: Secondary | ICD-10-CM | POA: Diagnosis not present

## 2018-04-26 DIAGNOSIS — Z87891 Personal history of nicotine dependence: Secondary | ICD-10-CM | POA: Insufficient documentation

## 2018-04-26 DIAGNOSIS — K810 Acute cholecystitis: Principal | ICD-10-CM

## 2018-04-26 DIAGNOSIS — E871 Hypo-osmolality and hyponatremia: Secondary | ICD-10-CM | POA: Diagnosis not present

## 2018-04-26 DIAGNOSIS — J45909 Unspecified asthma, uncomplicated: Secondary | ICD-10-CM | POA: Diagnosis not present

## 2018-04-26 DIAGNOSIS — Z79899 Other long term (current) drug therapy: Secondary | ICD-10-CM | POA: Diagnosis not present

## 2018-04-26 DIAGNOSIS — K5730 Diverticulosis of large intestine without perforation or abscess without bleeding: Secondary | ICD-10-CM

## 2018-04-26 DIAGNOSIS — R1013 Epigastric pain: Secondary | ICD-10-CM | POA: Diagnosis not present

## 2018-04-26 LAB — URINALYSIS, ROUTINE W REFLEX MICROSCOPIC
Bilirubin Urine: NEGATIVE
GLUCOSE, UA: NEGATIVE mg/dL
Hgb urine dipstick: NEGATIVE
Ketones, ur: 20 mg/dL — AB
Leukocytes, UA: NEGATIVE
NITRITE: NEGATIVE
PROTEIN: NEGATIVE mg/dL
SPECIFIC GRAVITY, URINE: 1.014 (ref 1.005–1.030)
pH: 5 (ref 5.0–8.0)

## 2018-04-26 LAB — CBC
HCT: 40.6 % (ref 36.0–46.0)
HEMOGLOBIN: 12.9 g/dL (ref 12.0–15.0)
MCH: 28.7 pg (ref 26.0–34.0)
MCHC: 31.8 g/dL (ref 30.0–36.0)
MCV: 90.4 fL (ref 80.0–100.0)
Platelets: 346 10*3/uL (ref 150–400)
RBC: 4.49 MIL/uL (ref 3.87–5.11)
RDW: 13.3 % (ref 11.5–15.5)
WBC: 14 10*3/uL — AB (ref 4.0–10.5)
nRBC: 0 % (ref 0.0–0.2)

## 2018-04-26 LAB — COMPREHENSIVE METABOLIC PANEL
ALK PHOS: 90 U/L (ref 38–126)
ALT: 19 U/L (ref 0–44)
AST: 23 U/L (ref 15–41)
Albumin: 4.6 g/dL (ref 3.5–5.0)
Anion gap: 12 (ref 5–15)
BUN: 12 mg/dL (ref 6–20)
CO2: 22 mmol/L (ref 22–32)
Calcium: 9.1 mg/dL (ref 8.9–10.3)
Chloride: 100 mmol/L (ref 98–111)
Creatinine, Ser: 0.86 mg/dL (ref 0.44–1.00)
GFR calc Af Amer: >60 mL/min (ref >60)
Glucose, Bld: 161 mg/dL — ABNORMAL HIGH (ref 70–99)
POTASSIUM: 3.6 mmol/L (ref 3.5–5.1)
Sodium: 134 mmol/L — ABNORMAL LOW (ref 135–145)
TOTAL PROTEIN: 8.5 g/dL — AB (ref 6.5–8.1)
Total Bilirubin: 0.7 mg/dL (ref 0.3–1.2)

## 2018-04-26 LAB — LIPASE, BLOOD: Lipase: 36 U/L (ref 11–51)

## 2018-04-26 LAB — TROPONIN I: Troponin I: <0.03 ng/mL (ref <0.03)

## 2018-04-26 LAB — PREGNANCY, URINE: Preg Test, Ur: NEGATIVE

## 2018-04-26 MED ORDER — METRONIDAZOLE IN NACL 5-0.79 MG/ML-% IV SOLN
500.0000 mg | Freq: Once | INTRAVENOUS | Status: AC
  Administered 2018-04-27: 500 mg via INTRAVENOUS
  Filled 2018-04-26: qty 100

## 2018-04-26 MED ORDER — MORPHINE SULFATE (PF) 4 MG/ML IV SOLN: 4.0000 mg | INTRAVENOUS | Status: DC | PRN

## 2018-04-26 MED ORDER — ONDANSETRON HCL 4 MG/2ML IJ SOLN: 4.0000 mg | INTRAMUSCULAR | Status: DC | PRN

## 2018-04-26 MED ORDER — IOPAMIDOL (ISOVUE-300) INJECTION 61%
100.0000 mL | Freq: Once | INTRAVENOUS | Status: AC | PRN
  Administered 2018-04-26: 100 mL via INTRAVENOUS

## 2018-04-26 MED ORDER — CIPROFLOXACIN IN D5W 400 MG/200ML IV SOLN: 400.0000 mg | Freq: Once | INTRAVENOUS | Status: AC

## 2018-04-26 MED ORDER — SODIUM CHLORIDE 0.9 % IV SOLN: INTRAVENOUS | Status: DC

## 2018-04-26 MED ORDER — FAMOTIDINE IN NACL 20-0.9 MG/50ML-% IV SOLN
20.0000 mg | Freq: Once | INTRAVENOUS | Status: AC
  Administered 2018-04-27: 20 mg via INTRAVENOUS
  Filled 2018-04-26: qty 50

## 2018-04-26 NOTE — ED Triage Notes (Addendum)
Pt c/o sudden onset of upper abdominal cramping that began around 1315. Pt endorses n/v. Pt given toradol IM at [facility].

## 2018-04-26 NOTE — ED Provider Notes (Signed)
[facility] EMERGENCY DEPARTMENT Provider Note   CSN: [id number] Arrival date & time: 04/26/18  1730     History   Chief Complaint Chief Complaint  Patient presents with  . Abdominal Pain    HPI Allison Knox is a 47 y.o. female.  HPI  Pt was seen at 2200.  Per pt, c/o gradual onset and persistence of constant upper abd "pain" since approximately 1315 today. Has been associated with N/V.  Describes the abd pain as "cramping." Pt states her symptoms began after she ate approximately 1230. Pt states she went to an [facility], received antiemetic and IM toradol with "some" improvement. States the pain radiates up into her chest. Endorses similar upper abd symptoms approximately 6 months ago, associated with certain foods (ie: tomato), but "this time was much worse."  Denies diarrhea, no fevers, no back pain, no rash, no cough/SOB, no palpitations/CP, no black or blood in stools or emesis.      Past Medical History:  Diagnosis Date  . Abnormal vaginal bleeding   . Asthma    seasonal  . Ovarian cyst     Patient Active Problem List   Diagnosis Date Noted  . Cervical dystonia 06/22/2016    Past Surgical History:  Procedure Laterality Date  . HAND SURGERY    . HERNIA REPAIR    . OVARIAN CYST REMOVAL       OB History    Gravida  2   Para  2   Term  2   Preterm      AB      Living  2     SAB      TAB      Ectopic      Multiple      Live Births               Home Medications    Prior to Admission medications   Medication Sig Start Date End Date Taking? Authorizing Provider  ascorbic acid (QC VITAMIN C) 1000 MG tablet Take 1,000 mg by mouth every morning. Includes Zinc   Yes [provider]  Cholecalciferol (VITAMIN D3) 50 MCG (2000 UT) TABS Take 1 tablet by mouth every morning.   Yes [provider]  desonide (DESOWEN) 0.05 % ointment Apply 1 application topically 2 (two) times daily.  04/22/18  Yes [provider]    levocetirizine (XYZAL) 5 MG tablet Take 5 mg by mouth every evening.   Yes [provider]    Family History Family History  Problem Relation Age of Onset  . Neuromuscular disorder Neg Hx     Social History Social History   Tobacco Use  . Smoking status: Former Research scientist (life sciences)  . Smokeless tobacco: Never Used  Substance Use Topics  . Alcohol use: Yes    Comment: occ  . Drug use: No     Allergies   Penicillins and Codeine   Review of Systems Review of Systems ROS: Statement: All systems negative except as marked or noted in the HPI; Constitutional: Negative for fever and chills. ; ; Eyes: Negative for eye pain, redness and discharge. ; ; ENMT: Negative for ear pain, hoarseness, nasal congestion, sinus pressure and sore throat. ; ; Cardiovascular: Negative for chest pain, palpitations, diaphoresis, dyspnea and peripheral edema. ; ; Respiratory: Negative for cough, wheezing and stridor. ; ; Gastrointestinal: +N/V, abd pain. Negative for diarrhea, blood in stool, hematemesis, jaundice and rectal bleeding. . ; ; Genitourinary: Negative for dysuria, flank pain and  hematuria. ; ; Musculoskeletal: Negative for back pain and neck pain. Negative for swelling and trauma.; ; Skin: Negative for pruritus, rash, abrasions, blisters, bruising and skin lesion.; ; Neuro: Negative for headache, lightheadedness and neck stiffness. Negative for weakness, altered level of consciousness, altered mental status, extremity weakness, paresthesias, involuntary movement, seizure and syncope.       Physical Exam Updated Vital Signs BP 118/78 (BP Location: Right Arm)   Pulse 70   Temp 97.8 F (36.6 C) (Oral)   Resp 16   Ht 5\' 7"  (1.702 m)   Wt 79.4 kg   LMP 03/19/2018 (Approximate)   SpO2 99%   BMI 27.41 kg/m   Physical Exam 2205: Physical examination:  Nursing notes reviewed; Vital signs and O2 SAT reviewed;  Constitutional: Well developed, Well nourished, Well hydrated, In no acute distress;  Head:  Normocephalic, atraumatic; Eyes: EOMI, PERRL, No scleral icterus; ENMT: Mouth and pharynx normal, Mucous membranes moist; Neck: Supple, Full range of motion, No lymphadenopathy; Cardiovascular: Regular rate and rhythm, No gallop; Respiratory: Breath sounds clear & equal bilaterally, No wheezes.  Speaking full sentences with ease, Normal respiratory effort/excursion; Chest: Nontender, Movement normal; Abdomen: Soft, +RUQ, mid-epigastric tenderness to palp. No rebound or guarding. Nondistended, Normal bowel sounds; Genitourinary: No CVA tenderness; Extremities: Peripheral pulses normal, No tenderness, No edema, No calf edema or asymmetry.; Neuro: AA&Ox3, Major CN grossly intact.  Speech clear. No gross focal motor or sensory deficits in extremities.; Skin: Color normal, Warm, Dry.   ED Treatments / Results  Labs (all labs ordered are listed, but only abnormal results are displayed)   EKG EKG Interpretation  Date/Time:  Monday April 26 2018 23:42:45 EST Ventricular Rate:  68 PR Interval:    QRS Duration: 96 QT Interval:  432 QTC Calculation: 460 R Axis:   88 Text Interpretation:  Sinus rhythm No old tracing to compare Confirmed by Francine Graven [phone]) on 04/26/2018 11:46:03 PM   Radiology   Procedures Procedures (including critical care time)  Medications Ordered in ED Medications  famotidine (PEPCID) IVPB 20 mg premix (has no administration in time range)  ciprofloxacin (CIPRO) IVPB 400 mg (has no administration in time range)  metroNIDAZOLE (FLAGYL) IVPB 500 mg (has no administration in time range)  morphine 4 MG/ML injection 4 mg (has no administration in time range)  ondansetron (ZOFRAN) injection 4 mg (has no administration in time range)  0.9 %  sodium chloride infusion (has no administration in time range)  iopamidol (ISOVUE-300) 61 % injection 100 mL (100 mLs Intravenous Contrast Given 04/26/18 2248)     Initial Impression / Assessment and Plan / ED Course    I have reviewed the triage vital signs and the nursing notes.  Pertinent labs & imaging results that were available during my care of the patient were reviewed by me and considered in my medical decision making (see chart for details).  MDM Reviewed: previous chart, nursing note and vitals Reviewed previous: labs and ECG Interpretation: labs, ECG, x-ray and CT scan   Results for orders placed or performed during the hospital encounter of 04/26/18  Lipase, blood  Result Value Ref Range   Lipase 36 11 - 51 U/L  Comprehensive metabolic panel  Result Value Ref Range   Sodium 134 (L) 135 - 145 mmol/L   Potassium 3.6 3.5 - 5.1 mmol/L   Chloride 100 98 - 111 mmol/L   CO2 22 22 - 32 mmol/L   Glucose, Bld 161 (H) 70 - 99 mg/dL   BUN  12 6 - 20 mg/dL   Creatinine, Ser 0.86 0.44 - 1.00 mg/dL   Calcium 9.1 8.9 - 10.3 mg/dL   Total Protein 8.5 (H) 6.5 - 8.1 g/dL   Albumin 4.6 3.5 - 5.0 g/dL   AST 23 15 - 41 U/L   ALT 19 0 - 44 U/L   Alkaline Phosphatase 90 38 - 126 U/L   Total Bilirubin 0.7 0.3 - 1.2 mg/dL   GFR calc non Af Amer >60 >60 mL/min   GFR calc Af Amer >60 >60 mL/min   Anion gap 12 5 - 15  CBC  Result Value Ref Range   WBC 14.0 (H) 4.0 - 10.5 K/uL   RBC 4.49 3.87 - 5.11 MIL/uL   Hemoglobin 12.9 12.0 - 15.0 g/dL   HCT 40.6 36.0 - 46.0 %   MCV 90.4 80.0 - 100.0 fL   MCH 28.7 26.0 - 34.0 pg   MCHC 31.8 30.0 - 36.0 g/dL   RDW 13.3 11.5 - 15.5 %   Platelets 346 150 - 400 K/uL   nRBC 0.0 0.0 - 0.2 %  Urinalysis, Routine w reflex microscopic  Result Value Ref Range   Color, Urine YELLOW YELLOW   APPearance CLEAR CLEAR   Specific Gravity, Urine 1.014 1.005 - 1.030   pH 5.0 5.0 - 8.0   Glucose, UA NEGATIVE NEGATIVE mg/dL   Hgb urine dipstick NEGATIVE NEGATIVE   Bilirubin Urine NEGATIVE NEGATIVE   Ketones, ur 20 (A) NEGATIVE mg/dL   Protein, ur NEGATIVE NEGATIVE mg/dL   Nitrite NEGATIVE NEGATIVE   Leukocytes, UA NEGATIVE NEGATIVE  Pregnancy, urine  Result Value Ref  Range   Preg Test, Ur NEGATIVE NEGATIVE   Ct Abdomen Pelvis W Contrast Result Date: 04/26/2018 CLINICAL DATA:  Upper abdominal pain with nausea vomiting. EXAM: CT ABDOMEN AND PELVIS WITH CONTRAST TECHNIQUE: Multidetector CT imaging of the abdomen and pelvis was performed using the standard protocol following bolus administration of intravenous contrast. CONTRAST:  159mL ISOVUE-300 IOPAMIDOL (ISOVUE-300) INJECTION 61% COMPARISON:  07/09/2010 FINDINGS: Lower chest: No acute abnormality. Hepatobiliary: No focal liver abnormality is seen. Mild hyperenhancement of the gallbladder wall with pericholecystic fluid. Pancreas: Unremarkable. No pancreatic ductal dilatation or surrounding inflammatory changes. Spleen: Normal in size without focal abnormality. Adrenals/Urinary Tract: Adrenal glands are unremarkable. Kidneys are without without renal calculi, or hydronephrosis. 8 mm too small to be actually characterized hypoattenuated lesion in the cortex of the midpole region of the left kidney. Bladder is unremarkable. Stomach/Bowel: Stomach is within normal limits. Appendix appears normal. No evidence of bowel wall thickening, distention, or inflammatory changes. Scattered colonic diverticulosis. Vascular/Lymphatic: Mild aortic atherosclerosis. No enlarged abdominal or pelvic lymph nodes. Reproductive: Uterus and bilateral adnexa are unremarkable. Other: No abdominal wall hernia or abnormality. No abdominopelvic ascites. Musculoskeletal: No acute or significant osseous findings. IMPRESSION: Hyperenhancement of the gallbladder wall with pericholecystic fluid. In the appropriate clinical setting, these findings are suggestive of acute cholecystitis. Confirmation with right upper quadrant ultrasound may be considered. Scattered colonic diverticulosis. Electronically Signed   By: Fidela Salisbury M.D.   On: 04/26/2018 23:16   Dg Chest 2 View Result Date: 04/26/2018 CLINICAL DATA:  Chest and epigastric pain beginning  today. Asthma. Former smoker. EXAM: CHEST - 2 VIEW COMPARISON:  07/08/2010 FINDINGS: The heart size and mediastinal contours are within normal limits. Both lungs are clear. The visualized skeletal structures are unremarkable. IMPRESSION: No active cardiopulmonary disease. Electronically Signed   By: Earle Gell M.D.   On: 04/26/2018 23:31  2330:  Pt feels improved after meds. CT as above. Remains afebrile with stable VS. Dx and testing d/w pt and family.  Questions answered.  Verb understanding, agreeable to admit. T/C returned from [facility] Dr. Arnoldo Morale, case discussed, including:  HPI, pertinent PM/SHx, VS/PE, dx testing, ED course and treatment:  Agreeable to consult tomorrow, requests to admit to [facility] service.   2345:  T/C returned from [facility] Dr. Olevia Bowens, case discussed, including:  HPI, pertinent PM/SHx, VS/PE, dx testing, ED course and treatment:  Agreeable to admit.        Final Clinical Impressions(s) / ED Diagnoses   Final diagnoses:  None    ED Discharge Orders    None       Francine Graven, DO 04/29/18 1628

## 2018-04-26 NOTE — H&P (Signed)
History and Physical    Allison Knox EHO:[id number] DOB: [birth year] DOA: 04/26/2018  PCP: Jonathon Jordan, MD   Patient coming from: Home.  I have personally briefly reviewed patient's old medical records in [place]  Chief Complaint: Abdominal pain.  HPI: Allison Knox is a 47 y.o. female with medical history significant of asthma, seasonal allergies, history of ovarian cyst who is coming to the emergency department with complaints of abdominal pain shortly after she ate a salad around 1300 today.  She states that the pain was so intense that she decided to come to the urgent care center, but had to pull over due to nausea and an episode of emesis.  She was given Toradol IM at the UC.  She states that she has had some discomfort in the past, to the point that she has done away with a urinary and gluten-containing products in her diet, but never anything as intense as earlier today.  She denies diarrhea, constipation, melena or hematochezia.  No dysuria, frequency or hematuria.  She denies fever, chills, sore throat, rhinorrhea, productive cough, wheezing, hemoptysis, chest pain, palpitations, dizziness, diaphoresis, PND, orthopnea or pitting edema of the lower extremities.  She denies heat or cold intolerance.  No polyuria, polydipsia, polyphagia or blurred vision.  Denies skin rashes or pruritus.  ED Course: Her vital signs initially in the emergency department temperature 97.8 F, pulse 70, respirations 16, blood pressure 118/78 mmHg and O2 sat 99% on room air.  She received famotidine 20 mg IVP x1 dose, ciprofloxacin and metronidazole IV was started.  Her urinalysis shows ketones of 20 mg/dL, but all other values are within normal limits.  Pregnancy test was negative.  White count was 14.0, hemoglobin 12.9 g/dL and platelets 346.  CMP shows a sodium level of 134 mmol/L, glucose of 161 mg/dL and a total protein of 8.5 g/dL.   Imaging: Chest radiograph did not show any active  cardiopulmonary disease.  CT abdomen/pelvis shows increased enhancement of the gallbladder wall with with peri-cholecystic fluid.  RUQ ultrasound recommended.  See images and full radiology report for further detail.  Review of Systems: As per HPI otherwise 10 point review of systems negative.   Past Medical History:  Diagnosis Date  . Abnormal vaginal bleeding   . Asthma    seasonal  . Ovarian cyst     Past Surgical History:  Procedure Laterality Date  . HAND SURGERY    . HERNIA REPAIR    . OVARIAN CYST REMOVAL       reports that she has quit smoking. She has never used smokeless tobacco. She reports that she drinks alcohol. She reports that she does not use drugs.  Allergies  Allergen Reactions  . Penicillins Rash    Has patient had a PCN reaction causing immediate rash, facial/tongue/throat swelling, SOB or lightheadedness with hypotension: Yes Has patient had a PCN reaction causing severe rash involving mucus membranes or skin necrosis: No Has patient had a PCN reaction that required hospitalization: No Has patient had a PCN reaction occurring within the last 10 years: No If all of the above answers are "NO", then may proceed with Cephalosporin use.   . Codeine     Makes her "hyper"    Family History  Problem Relation Age of Onset  . Neuromuscular disorder Neg Hx    Prior to Admission medications   Medication Sig Start Date End Date Taking? Authorizing Provider  ascorbic acid (QC VITAMIN C) 1000 MG tablet Take  1,000 mg by mouth every morning. Includes Zinc   Yes [provider]  Cholecalciferol (VITAMIN D3) 50 MCG (2000 UT) TABS Take 1 tablet by mouth every morning.   Yes [provider]  desonide (DESOWEN) 0.05 % ointment Apply 1 application topically 2 (two) times daily.  04/22/18  Yes [provider]  levocetirizine (XYZAL) 5 MG tablet Take 5 mg by mouth every evening.   Yes [provider]    Physical Exam: Vitals:   04/26/18  1800 04/26/18 1802  BP: 118/78   Pulse: 70   Resp: 16   Temp: 97.8 F (36.6 C)   TempSrc: Oral   SpO2: 99%   Weight:  79.4 kg  Height:  5\' 7"  (1.702 m)    Constitutional: NAD, calm, comfortable Eyes: PERRL, lids and conjunctivae normal ENMT: Mucous membranes are mildly dry. Posterior pharynx clear of any exudate or lesions. Neck: normal, supple, no masses, no thyromegaly Respiratory: clear to auscultation bilaterally, no wheezing, no crackles. Normal respiratory effort. No accessory muscle use.  Cardiovascular: Regular rate and rhythm, no murmurs / rubs / gallops. No extremity edema. 2+ pedal pulses. No carotid bruits.  Abdomen: Soft, no tenderness, no masses palpated. No hepatosplenomegaly. Bowel sounds positive.  Musculoskeletal: no clubbing / cyanosis. Good ROM, no contractures. Normal muscle tone.  Skin: no rashes, lesions, ulcers on very limited dermatological examination. Neurologic: CN 2-12 grossly intact. Sensation intact, DTR normal. Strength 5/5 in all 4.  Psychiatric: Normal judgment and insight. Alert and oriented x 3. Normal mood.   Labs on Admission: I have personally reviewed following labs and imaging studies  CBC: Recent Labs  Lab 04/26/18 1816  WBC 14.0*  HGB 12.9  HCT 40.6  MCV 90.4  PLT 161   Basic Metabolic Panel: Recent Labs  Lab 04/26/18 1816  NA 134*  K 3.6  CL 100  CO2 22  GLUCOSE 161*  BUN 12  CREATININE 0.86  CALCIUM 9.1   GFR: Estimated Creatinine Clearance: 87.7 mL/min (by C-G formula based on SCr of 0.86 mg/dL). Liver Function Tests: Recent Labs  Lab 04/26/18 1816  AST 23  ALT 19  ALKPHOS 90  BILITOT 0.7  PROT 8.5*  ALBUMIN 4.6   Recent Labs  Lab 04/26/18 1816  LIPASE 36   No results for input(s): AMMONIA in the last 168 hours. Coagulation Profile: No results for input(s): INR, PROTIME in the last 168 hours. Cardiac Enzymes: Recent Labs  Lab 04/26/18 2218  TROPONINI <0.03   BNP (last 3 results) No results for  input(s): PROBNP in the last 8760 hours. HbA1C: No results for input(s): HGBA1C in the last 72 hours. CBG: No results for input(s): GLUCAP in the last 168 hours. Lipid Profile: No results for input(s): CHOL, HDL, LDLCALC, TRIG, CHOLHDL, LDLDIRECT in the last 72 hours. Thyroid Function Tests: No results for input(s): TSH, T4TOTAL, FREET4, T3FREE, THYROIDAB in the last 72 hours. Anemia Panel: No results for input(s): VITAMINB12, FOLATE, FERRITIN, TIBC, IRON, RETICCTPCT in the last 72 hours. Urine analysis:    Component Value Date/Time   COLORURINE YELLOW 04/26/2018 1802   APPEARANCEUR CLEAR 04/26/2018 1802   LABSPEC 1.014 04/26/2018 1802   PHURINE 5.0 04/26/2018 1802   GLUCOSEU NEGATIVE 04/26/2018 1802   HGBUR NEGATIVE 04/26/2018 1802   BILIRUBINUR NEGATIVE 04/26/2018 1802   KETONESUR 20 (A) 04/26/2018 1802   PROTEINUR NEGATIVE 04/26/2018 1802   UROBILINOGEN 0.2 08/16/2013 1539   NITRITE NEGATIVE 04/26/2018 1802   LEUKOCYTESUR NEGATIVE 04/26/2018 1802  Radiological Exams on Admission: Dg Chest 2 View  Result Date: 04/26/2018 CLINICAL DATA:  Chest and epigastric pain beginning today. Asthma. Former smoker. EXAM: CHEST - 2 VIEW COMPARISON:  07/08/2010 FINDINGS: The heart size and mediastinal contours are within normal limits. Both lungs are clear. The visualized skeletal structures are unremarkable. IMPRESSION: No active cardiopulmonary disease. Electronically Signed   By: Earle Gell M.D.   On: 04/26/2018 23:31   Ct Abdomen Pelvis W Contrast  Result Date: 04/26/2018 CLINICAL DATA:  Upper abdominal pain with nausea vomiting. EXAM: CT ABDOMEN AND PELVIS WITH CONTRAST TECHNIQUE: Multidetector CT imaging of the abdomen and pelvis was performed using the standard protocol following bolus administration of intravenous contrast. CONTRAST:  157mL ISOVUE-300 IOPAMIDOL (ISOVUE-300) INJECTION 61% COMPARISON:  07/09/2010 FINDINGS: Lower chest: No acute abnormality. Hepatobiliary: No focal  liver abnormality is seen. Mild hyperenhancement of the gallbladder wall with pericholecystic fluid. Pancreas: Unremarkable. No pancreatic ductal dilatation or surrounding inflammatory changes. Spleen: Normal in size without focal abnormality. Adrenals/Urinary Tract: Adrenal glands are unremarkable. Kidneys are without without renal calculi, or hydronephrosis. 8 mm too small to be actually characterized hypoattenuated lesion in the cortex of the midpole region of the left kidney. Bladder is unremarkable. Stomach/Bowel: Stomach is within normal limits. Appendix appears normal. No evidence of bowel wall thickening, distention, or inflammatory changes. Scattered colonic diverticulosis. Vascular/Lymphatic: Mild aortic atherosclerosis. No enlarged abdominal or pelvic lymph nodes. Reproductive: Uterus and bilateral adnexa are unremarkable. Other: No abdominal wall hernia or abnormality. No abdominopelvic ascites. Musculoskeletal: No acute or significant osseous findings. IMPRESSION: Hyperenhancement of the gallbladder wall with pericholecystic fluid. In the appropriate clinical setting, these findings are suggestive of acute cholecystitis. Confirmation with right upper quadrant ultrasound may be considered. Scattered colonic diverticulosis. Electronically Signed   By: Fidela Salisbury M.D.   On: 04/26/2018 23:16    EKG: Independently reviewed.  Vent. rate 68 BPM PR interval * ms QRS duration 96 ms QT/QTc 432/460 ms P-R-T axes 67 88 66 Sinus rhythm.  Assessment/Plan Principal Problem:   Acute cholecystitis Observation/telemetry. Keep n.p.o. Continue IV fluids. Analgesics and antiemetics as needed. Continue ciprofloxacin 400 mg IVPB every 12 hours. Continue metronidazole 500 mg IVPB every 8 hours. RUQ ultrasound in a.m. General surgery to evaluate in a.m.  Active Problems:   Hyponatremia Secondary to emesis earlier. Continue IV fluids. Follow-up sodium level.    Colonic  diverticulosis High-fiber diet after resolution of cholecystitis.    DVT prophylaxis: Heparin SQ. Code Status: Full code. Family Communication: Disposition Plan: Observation for IV hydration, IV antibiotics, [place] of RUQ and general surgery evaluation. Consults called: General surgery Aviva Signs, MD). Admission status: Observation/telemetry.   Reubin Milan MD [facility] Pager [phone].  If 7PM-7AM, please contact night-coverage [email or web address] Password [facility]  04/26/2018, 11:58 PM

## 2018-04-27 ENCOUNTER — Observation Stay (HOSPITAL_COMMUNITY): Payer: BLUE CROSS/BLUE SHIELD

## 2018-04-27 ENCOUNTER — Encounter (HOSPITAL_COMMUNITY): Payer: Self-pay | Admitting: Internal Medicine

## 2018-04-27 DIAGNOSIS — E871 Hypo-osmolality and hyponatremia: Secondary | ICD-10-CM | POA: Diagnosis not present

## 2018-04-27 DIAGNOSIS — R1010 Upper abdominal pain, unspecified: Secondary | ICD-10-CM | POA: Diagnosis not present

## 2018-04-27 DIAGNOSIS — K5730 Diverticulosis of large intestine without perforation or abscess without bleeding: Secondary | ICD-10-CM

## 2018-04-27 DIAGNOSIS — K810 Acute cholecystitis: Secondary | ICD-10-CM | POA: Diagnosis not present

## 2018-04-27 LAB — CBC WITH DIFFERENTIAL/PLATELET
Abs Immature Granulocytes: 0.01 10*3/uL (ref 0.00–0.07)
Basophils Absolute: 0 10*3/uL (ref 0.0–0.1)
Basophils Relative: 1 %
EOS PCT: 6 %
Eosinophils Absolute: 0.5 10*3/uL (ref 0.0–0.5)
HEMATOCRIT: 35.1 % — AB (ref 36.0–46.0)
HEMOGLOBIN: 11 g/dL — AB (ref 12.0–15.0)
Immature Granulocytes: 0 %
LYMPHS ABS: 1.9 10*3/uL (ref 0.7–4.0)
Lymphocytes Relative: 25 %
MCH: 28.1 pg (ref 26.0–34.0)
MCHC: 31.3 g/dL (ref 30.0–36.0)
MCV: 89.5 fL (ref 80.0–100.0)
MONO ABS: 0.5 10*3/uL (ref 0.1–1.0)
Monocytes Relative: 6 %
Neutro Abs: 4.7 10*3/uL (ref 1.7–7.7)
Neutrophils Relative %: 62 %
Platelets: 300 10*3/uL (ref 150–400)
RBC: 3.92 MIL/uL (ref 3.87–5.11)
RDW: 13.6 % (ref 11.5–15.5)
WBC: 7.6 10*3/uL (ref 4.0–10.5)
nRBC: 0 % (ref 0.0–0.2)

## 2018-04-27 LAB — PHOSPHORUS: Phosphorus: 4.6 mg/dL (ref 2.5–4.6)

## 2018-04-27 LAB — COMPREHENSIVE METABOLIC PANEL
ALK PHOS: 70 U/L (ref 38–126)
ALT: 15 U/L (ref 0–44)
AST: 16 U/L (ref 15–41)
Albumin: 3.7 g/dL (ref 3.5–5.0)
Anion gap: 8 (ref 5–15)
BUN: 10 mg/dL (ref 6–20)
CHLORIDE: 106 mmol/L (ref 98–111)
CO2: 24 mmol/L (ref 22–32)
CREATININE: 0.82 mg/dL (ref 0.44–1.00)
Calcium: 8.4 mg/dL — ABNORMAL LOW (ref 8.9–10.3)
GFR calc Af Amer: >60 mL/min (ref >60)
GFR calc non Af Amer: >60 mL/min (ref >60)
Glucose, Bld: 137 mg/dL — ABNORMAL HIGH (ref 70–99)
Potassium: 3.3 mmol/L — ABNORMAL LOW (ref 3.5–5.1)
Sodium: 138 mmol/L (ref 135–145)
Total Bilirubin: 0.5 mg/dL (ref 0.3–1.2)
Total Protein: 6.7 g/dL (ref 6.5–8.1)

## 2018-04-27 LAB — MAGNESIUM: Magnesium: 2.4 mg/dL (ref 1.7–2.4)

## 2018-04-27 MED ORDER — ALBUTEROL SULFATE (2.5 MG/3ML) 0.083% IN NEBU: 2.5000 mg | INHALATION_SOLUTION | Freq: Four times a day (QID) | RESPIRATORY_TRACT | Status: DC

## 2018-04-27 MED ORDER — CIPROFLOXACIN IN D5W 400 MG/200ML IV SOLN
400.0000 mg | Freq: Two times a day (BID) | INTRAVENOUS | Status: DC
  Administered 2018-04-27: 400 mg via INTRAVENOUS
  Filled 2018-04-27: qty 200

## 2018-04-27 MED ORDER — HEPARIN SODIUM (PORCINE) 5000 UNIT/ML IJ SOLN
5000.0000 [IU] | Freq: Three times a day (TID) | INTRAMUSCULAR | Status: DC
  Administered 2018-04-27 (×2): 5000 [IU] via SUBCUTANEOUS
  Filled 2018-04-27 (×2): qty 1

## 2018-04-27 MED ORDER — FAMOTIDINE IN NACL 20-0.9 MG/50ML-% IV SOLN
20.0000 mg | Freq: Two times a day (BID) | INTRAVENOUS | Status: DC
  Administered 2018-04-27: 20 mg via INTRAVENOUS
  Filled 2018-04-27: qty 50

## 2018-04-27 MED ORDER — ALBUTEROL SULFATE (2.5 MG/3ML) 0.083% IN NEBU: 2.5000 mg | INHALATION_SOLUTION | RESPIRATORY_TRACT | Status: DC | PRN

## 2018-04-27 MED ORDER — POTASSIUM CHLORIDE CRYS ER 20 MEQ PO TBCR
40.0000 meq | EXTENDED_RELEASE_TABLET | Freq: Once | ORAL | Status: AC
  Administered 2018-04-27: 40 meq via ORAL
  Filled 2018-04-27: qty 2

## 2018-04-27 MED ORDER — IPRATROPIUM-ALBUTEROL 0.5-2.5 (3) MG/3ML IN SOLN: 3.0000 mL | Freq: Four times a day (QID) | RESPIRATORY_TRACT | Status: DC | PRN

## 2018-04-27 MED ORDER — ACETAMINOPHEN 650 MG RE SUPP: 650.0000 mg | Freq: Four times a day (QID) | RECTAL | Status: DC | PRN

## 2018-04-27 MED ORDER — POTASSIUM CHLORIDE IN NACL 20-0.9 MEQ/L-% IV SOLN
INTRAVENOUS | Status: AC
  Administered 2018-04-27: 02:00:00 via INTRAVENOUS

## 2018-04-27 MED ORDER — ONDANSETRON HCL 4 MG/2ML IJ SOLN: 4.0000 mg | Freq: Four times a day (QID) | INTRAMUSCULAR | Status: DC | PRN

## 2018-04-27 MED ORDER — FENTANYL CITRATE (PF) 100 MCG/2ML IJ SOLN: 50.0000 ug | INTRAMUSCULAR | Status: DC | PRN

## 2018-04-27 MED ORDER — METRONIDAZOLE 500 MG PO TABS: 500.0000 mg | ORAL_TABLET | Freq: Two times a day (BID) | ORAL | 0 refills | Status: AC

## 2018-04-27 MED ORDER — METRONIDAZOLE IN NACL 5-0.79 MG/ML-% IV SOLN
500.0000 mg | Freq: Three times a day (TID) | INTRAVENOUS | Status: DC
  Filled 2018-04-27: qty 100

## 2018-04-27 MED ORDER — IPRATROPIUM-ALBUTEROL 0.5-2.5 (3) MG/3ML IN SOLN
0.5000 mg | Freq: Four times a day (QID) | RESPIRATORY_TRACT | Status: DC
  Administered 2018-04-27: 3 mg via RESPIRATORY_TRACT
  Filled 2018-04-27: qty 3

## 2018-04-27 MED ORDER — ACETAMINOPHEN 325 MG PO TABS: 650.0000 mg | ORAL_TABLET | Freq: Four times a day (QID) | ORAL | Status: DC | PRN

## 2018-04-27 MED ORDER — ONDANSETRON HCL 4 MG PO TABS: 4.0000 mg | ORAL_TABLET | Freq: Four times a day (QID) | ORAL | Status: DC | PRN

## 2018-04-27 MED ORDER — CIPROFLOXACIN HCL 500 MG PO TABS: 500.0000 mg | ORAL_TABLET | Freq: Two times a day (BID) | ORAL | 0 refills | Status: AC

## 2018-04-27 NOTE — Discharge Summary (Signed)
Physician Discharge Summary  NANDIKA STETZER QIH:[id number] DOB: 1971-04-28 DOA: 04/26/2018  PCP: Jonathon Jordan, MD Surgeon: Dr. Aviva Signs  Admit date: 04/26/2018 Discharge date: 04/27/2018  Admitted From: Home  Disposition: Home  Recommendations for Outpatient Follow-up:  1. Follow up with PCP in 1 weeks 2. Please arrange outpatient HIDA scan to evaluate gallbladder function  Discharge Condition: STABLE   CODE STATUS: FULL    Brief Hospitalization Summary: Please see all hospital notes, images, labs for full details of the hospitalization.  HPI: Allison Knox is a 47 y.o. female with medical history significant of asthma, seasonal allergies, history of ovarian cyst who is coming to the emergency department with complaints of abdominal pain shortly after she ate a salad around 1300 today.  She states that the pain was so intense that she decided to come to the urgent care center, but had to pull over due to nausea and an episode of emesis.  She was given Toradol IM at the UC.  She states that she has had some discomfort in the past, to the point that she has done away with a urinary and gluten-containing products in her diet, but never anything as intense as earlier today.  She denies diarrhea, constipation, melena or hematochezia.  No dysuria, frequency or hematuria.  She denies fever, chills, sore throat, rhinorrhea, productive cough, wheezing, hemoptysis, chest pain, palpitations, dizziness, diaphoresis, PND, orthopnea or pitting edema of the lower extremities.  She denies heat or cold intolerance.  No polyuria, polydipsia, polyphagia or blurred vision.  Denies skin rashes or pruritus.  ED Course: Her vital signs initially in the emergency department temperature 97.8 F, pulse 70, respirations 16, blood pressure 118/78 mmHg and O2 sat 99% on room air.  She received famotidine 20 mg IVP x1 dose, ciprofloxacin and metronidazole IV was started.  Her urinalysis shows ketones of 20 mg/dL,  but all other values are within normal limits.  Pregnancy test was negative.  White count was 14.0, hemoglobin 12.9 g/dL and platelets 346.  CMP shows a sodium level of 134 mmol/L, glucose of 161 mg/dL and a total protein of 8.5 g/dL.   Imaging: Chest radiograph did not show any active cardiopulmonary disease.  CT abdomen/pelvis shows increased enhancement of the gallbladder wall with with peri-cholecystic fluid.  RUQ ultrasound recommended.  See images and full radiology report for further detail.  The patient was admitted to the hospital for observation and started on IV antibiotics and supportive therapy.  The patient was seen by general surgery in consultation.  The patient's leukocytosis resolved.  The patient's symptoms completely resolved.  She was seen by surgery who recommended ultrasound of the abdomen.  Findings were: Gallbladder wall thickening without pericholecystic fluid and negative sonographic Murphy sign. These changes may be related to a more chronic cholecystitis. Nonemergent HIDA scan may be helpful to assess for cystic duct patency as well as gallbladder function.  The patient had requested to discharge home.  Dr. Arnoldo Morale at that patient could discharge home and follow-up as needed with him in the office.  I have asked for patient to follow-up with her primary care provider next week to consider outpatient HIDA scan to assess gallbladder function.  Low-fat low-cholesterol diet recommended.  In addition, 5 days of Cipro/Flagyl prescribed.  The patient was given instructions to seek medical care or return to ER if symptoms come back, worsen or new problems develop.  She could also follow-up with general surgery as needed if her symptoms start coming back.  She verbalized understanding.   Discharge Diagnoses:  Principal Problem:   Acute cholecystitis Active Problems:   Hyponatremia   Diverticula of colon  Discharge Instructions: Discharge Instructions    Call MD for:  difficulty  breathing, headache or visual disturbances   Complete by:  As directed    Call MD for:  extreme fatigue   Complete by:  As directed    Call MD for:  persistant dizziness or light-headedness   Complete by:  As directed    Call MD for:  persistant nausea and vomiting   Complete by:  As directed    Call MD for:  severe uncontrolled pain   Complete by:  As directed    Diet - low sodium heart healthy   Complete by:  As directed    Increase activity slowly   Complete by:  As directed      Allergies as of 04/27/2018      Reactions   Penicillins Rash   Has patient had a PCN reaction causing immediate rash, facial/tongue/throat swelling, SOB or lightheadedness with hypotension: Yes Has patient had a PCN reaction causing severe rash involving mucus membranes or skin necrosis: No Has patient had a PCN reaction that required hospitalization: No Has patient had a PCN reaction occurring within the last 10 years: No If all of the above answers are "NO", then may proceed with Cephalosporin use.   Codeine    Makes her "hyper"      Medication List    TAKE these medications   ciprofloxacin 500 MG tablet Commonly known as:  CIPRO Take 1 tablet (500 mg total) by mouth 2 (two) times daily for 5 days.   desonide 0.05 % ointment Commonly known as:  DESOWEN Apply 1 application topically 2 (two) times daily.   levocetirizine 5 MG tablet Commonly known as:  XYZAL Take 5 mg by mouth every evening.   metroNIDAZOLE 500 MG tablet Commonly known as:  FLAGYL Take 1 tablet (500 mg total) by mouth 2 (two) times daily for 5 days.   QC VITAMIN C 1000 MG tablet Generic drug:  ascorbic acid Take 1,000 mg by mouth every morning. Includes Zinc   Vitamin D3 50 MCG (2000 UT) Tabs Take 1 tablet by mouth every morning.      Follow-up Information    Aviva Signs, MD Follow up.   Specialty:  General Surgery Why:  as needed Contact information: [address] [place]  [address] [phone]        Jonathon Jordan, MD. Schedule an appointment as soon as possible for a visit in 1 week(s).   Specialty:  Family Medicine Why:  Hospital Follow Up  Contact information: [address] [place] [address] [phone]          Allergies  Allergen Reactions  . Penicillins Rash    Has patient had a PCN reaction causing immediate rash, facial/tongue/throat swelling, SOB or lightheadedness with hypotension: Yes Has patient had a PCN reaction causing severe rash involving mucus membranes or skin necrosis: No Has patient had a PCN reaction that required hospitalization: No Has patient had a PCN reaction occurring within the last 10 years: No If all of the above answers are "NO", then may proceed with Cephalosporin use.   . Codeine     Makes her "hyper"   Allergies as of 04/27/2018      Reactions   Penicillins Rash   Has patient had a PCN reaction causing immediate rash, facial/tongue/throat swelling,  SOB or lightheadedness with hypotension: Yes Has patient had a PCN reaction causing severe rash involving mucus membranes or skin necrosis: No Has patient had a PCN reaction that required hospitalization: No Has patient had a PCN reaction occurring within the last 10 years: No If all of the above answers are "NO", then may proceed with Cephalosporin use.   Codeine    Makes her "hyper"      Medication List    TAKE these medications   ciprofloxacin 500 MG tablet Commonly known as:  CIPRO Take 1 tablet (500 mg total) by mouth 2 (two) times daily for 5 days.   desonide 0.05 % ointment Commonly known as:  DESOWEN Apply 1 application topically 2 (two) times daily.   levocetirizine 5 MG tablet Commonly known as:  XYZAL Take 5 mg by mouth every evening.   metroNIDAZOLE 500 MG tablet Commonly known as:  FLAGYL Take 1 tablet (500 mg total) by mouth 2 (two) times daily for 5 days.   QC VITAMIN C 1000 MG tablet Generic drug:   ascorbic acid Take 1,000 mg by mouth every morning. Includes Zinc   Vitamin D3 50 MCG (2000 UT) Tabs Take 1 tablet by mouth every morning.       Procedures/Studies: Dg Chest 2 View  Result Date: 04/26/2018 CLINICAL DATA:  Chest and epigastric pain beginning today. Asthma. Former smoker. EXAM: CHEST - 2 VIEW COMPARISON:  07/08/2010 FINDINGS: The heart size and mediastinal contours are within normal limits. Both lungs are clear. The visualized skeletal structures are unremarkable. IMPRESSION: No active cardiopulmonary disease. Electronically Signed   By: Earle Gell M.D.   On: 04/26/2018 23:31   Ct Abdomen Pelvis W Contrast  Result Date: 04/26/2018 CLINICAL DATA:  Upper abdominal pain with nausea vomiting. EXAM: CT ABDOMEN AND PELVIS WITH CONTRAST TECHNIQUE: Multidetector CT imaging of the abdomen and pelvis was performed using the standard protocol following bolus administration of intravenous contrast. CONTRAST:  151mL ISOVUE-300 IOPAMIDOL (ISOVUE-300) INJECTION 61% COMPARISON:  07/09/2010 FINDINGS: Lower chest: No acute abnormality. Hepatobiliary: No focal liver abnormality is seen. Mild hyperenhancement of the gallbladder wall with pericholecystic fluid. Pancreas: Unremarkable. No pancreatic ductal dilatation or surrounding inflammatory changes. Spleen: Normal in size without focal abnormality. Adrenals/Urinary Tract: Adrenal glands are unremarkable. Kidneys are without without renal calculi, or hydronephrosis. 8 mm too small to be actually characterized hypoattenuated lesion in the cortex of the midpole region of the left kidney. Bladder is unremarkable. Stomach/Bowel: Stomach is within normal limits. Appendix appears normal. No evidence of bowel wall thickening, distention, or inflammatory changes. Scattered colonic diverticulosis. Vascular/Lymphatic: Mild aortic atherosclerosis. No enlarged abdominal or pelvic lymph nodes. Reproductive: Uterus and bilateral adnexa are unremarkable. Other: No  abdominal wall hernia or abnormality. No abdominopelvic ascites. Musculoskeletal: No acute or significant osseous findings. IMPRESSION: Hyperenhancement of the gallbladder wall with pericholecystic fluid. In the appropriate clinical setting, these findings are suggestive of acute cholecystitis. Confirmation with right upper quadrant ultrasound may be considered. Scattered colonic diverticulosis. Electronically Signed   By: Fidela Salisbury M.D.   On: 04/26/2018 23:16   US Abdomen Limited Ruq  Result Date: 04/27/2018 CLINICAL DATA:  Upper abdominal pain with abnormal gallbladder on recent CT EXAM: ULTRASOUND ABDOMEN LIMITED RIGHT UPPER QUADRANT COMPARISON:  CT from the previous day FINDINGS: Gallbladder: Gallbladder is well distended. Gallbladder wall is thickened to 4.8 mm. This is consistent with that seen on recent CT examination. No cholelithiasis is noted. No pericholecystic fluid is seen. Negative sonographic Percell Miller sign is elicited. Common  bile duct: Diameter: 2.7 mm. Liver: No focal lesion identified. Within normal limits in parenchymal echogenicity. Portal vein is patent on color Doppler imaging with normal direction of blood flow towards the liver. IMPRESSION: Gallbladder wall thickening without pericholecystic fluid and negative sonographic Murphy sign. These changes may be related to a more chronic cholecystitis. Nonemergent HIDA scan may be helpful to assess for cystic duct patency as well as gallbladder function. Electronically Signed   By: Inez Catalina M.D.   On: 04/27/2018 09:31      Subjective: Pt says that she would like to go home.  Pt is having no pain and tolerating diet.  Pt was seen by surgery and no acute surgical needs and patient could go home.    Discharge Exam: Vitals:   04/27/18 0235 04/27/18 0715  BP:  114/73  Pulse:  70  Resp:  18  Temp:  97.8 F (36.6 C)  SpO2: 98% 99%   Vitals:   04/27/18 0030 04/27/18 0157 04/27/18 0235 04/27/18 0715  BP:  124/77  114/73   Pulse:  68  70  Resp: 20 16  18   Temp:  97.7 F (36.5 C)  97.8 F (36.6 C)  TempSrc:  Oral  Other (Comment)  SpO2:  99% 98% 99%  Weight:  80.3 kg    Height:  5\' 7"  (1.702 m)      General: Pt is alert, awake, not in acute distress Cardiovascular: RRR, S1/S2 +, no rubs, no gallops Respiratory: CTA bilaterally, no wheezing, no rhonchi Abdominal: Soft, NT, ND, bowel sounds + Extremities: no edema, no cyanosis   The results of significant diagnostics from this hospitalization (including imaging, microbiology, ancillary and laboratory) are listed below for reference.     Microbiology: No results found for this or any previous visit (from the past 240 hour(s)).   Labs: BNP (last 3 results) No results for input(s): BNP in the last 8760 hours. Basic Metabolic Panel: Recent Labs  Lab 04/26/18 1816 04/26/18 2218 04/27/18 0446  NA 134*  --  138  K 3.6  --  3.3*  CL 100  --  106  CO2 22  --  24  GLUCOSE 161*  --  137*  BUN 12  --  10  CREATININE 0.86  --  0.82  CALCIUM 9.1  --  8.4*  MG  --  2.4  --   PHOS  --  4.6  --    Liver Function Tests: Recent Labs  Lab 04/26/18 1816 04/27/18 0446  AST 23 16  ALT 19 15  ALKPHOS 90 70  BILITOT 0.7 0.5  PROT 8.5* 6.7  ALBUMIN 4.6 3.7   Recent Labs  Lab 04/26/18 1816  LIPASE 36   No results for input(s): AMMONIA in the last 168 hours. CBC: Recent Labs  Lab 04/26/18 1816 04/27/18 0446  WBC 14.0* 7.6  NEUTROABS  --  4.7  HGB 12.9 11.0*  HCT 40.6 35.1*  MCV 90.4 89.5  PLT 346 300   Cardiac Enzymes: Recent Labs  Lab 04/26/18 2218  TROPONINI <0.03   BNP: Invalid input(s): POCBNP CBG: No results for input(s): GLUCAP in the last 168 hours. D-Dimer No results for input(s): DDIMER in the last 72 hours. Hgb A1c No results for input(s): HGBA1C in the last 72 hours. Lipid Profile No results for input(s): CHOL, HDL, LDLCALC, TRIG, CHOLHDL, LDLDIRECT in the last 72 hours. Thyroid function studies No results for  input(s): TSH, T4TOTAL, T3FREE, THYROIDAB in the last 72 hours.  Invalid input(s): FREET3 Anemia work up No results for input(s): VITAMINB12, FOLATE, FERRITIN, TIBC, IRON, RETICCTPCT in the last 72 hours. Urinalysis    Component Value Date/Time   COLORURINE YELLOW 04/26/2018 1802   APPEARANCEUR CLEAR 04/26/2018 1802   LABSPEC 1.014 04/26/2018 1802   PHURINE 5.0 04/26/2018 1802   GLUCOSEU NEGATIVE 04/26/2018 1802   HGBUR NEGATIVE 04/26/2018 1802   BILIRUBINUR NEGATIVE 04/26/2018 1802   KETONESUR 20 (A) 04/26/2018 1802   PROTEINUR NEGATIVE 04/26/2018 1802   UROBILINOGEN 0.2 08/16/2013 1539   NITRITE NEGATIVE 04/26/2018 1802   LEUKOCYTESUR NEGATIVE 04/26/2018 1802   Sepsis Labs Invalid input(s): PROCALCITONIN,  WBC,  LACTICIDVEN Microbiology No results found for this or any previous visit (from the past 240 hour(s)).  Time coordinating discharge:   SIGNED:  Irwin Brakeman, MD  [facility] 04/27/2018, 9:56 AM Pager [phone]  If 7PM-7AM, please contact night-coverage [email or web address] Password [email or web address]

## 2018-04-27 NOTE — Progress Notes (Signed)
Patient discharged home.  IV removed - WNL.  Reviewed AVS and medications.  Instructed to follow up with PCP and Sx as needed.  Instructed to seek medical care if symptoms return.  Verbalizes understanding,  No questions at this time.  Patient in NAD

## 2018-04-27 NOTE — Discharge Instructions (Signed)
Seek medical care or return to ER if symptoms come back, worsen or new problems develop.  Low-fat low-cholesterol diet recommended.  Follow up with Dr. Arnoldo Morale as needed if gallbladder symptoms come back.    Cholecystitis Cholecystitis is inflammation of the gallbladder. It is often called a gallbladder attack. The gallbladder is a pear-shaped organ that lies beneath the liver on the right side of the body. The gallbladder stores bile, which is a fluid that helps the body to digest fats. If bile builds up in your gallbladder, your gallbladder becomes inflamed. This condition may occur suddenly (be acute). Repeat episodes of acute cholecystitis or prolonged episodes may lead to a long-term (chronic) condition. Cholecystitis is serious and it requires treatment. What are the causes? The most common cause of this condition is gallstones. Gallstones can block the tube (duct) that carries bile out of your gallbladder. This causes bile to build up. Other causes of this condition include:  Damage to the gallbladder due to a decrease in blood flow.  Infections in the bile ducts.  Scars or kinks in the bile ducts.  Tumors in the liver, pancreas, or gallbladder.  What increases the risk? This condition is more likely to develop in:  People who have sickle cell disease.  People who take birth control pills or use estrogen.  People who have alcoholic liver disease.  People who have liver cirrhosis.  People who have their nutrition delivered through a vein (parenteral nutrition).  People who do not eat or drink (do fasting) for a long period of time.  People who are obese.  People who have rapid weight loss.  People who are pregnant.  People who have increased triglyceride levels.  People who have pancreatitis.  What are the signs or symptoms? Symptoms of this condition include:  Abdominal pain, especially in the upper right area of the abdomen.  Abdominal tenderness or  bloating.  Nausea.  Vomiting.  Fever.  Chills.  Yellowing of the skin and the whites of the eyes (jaundice).  How is this diagnosed? This condition is diagnosed with a medical history and physical exam. You may also have other tests, including:  Imaging tests, such as: ? An ultrasound of the gallbladder. ? A CT scan of the abdomen. ? A gallbladder nuclear scan (HIDA scan). This scan allows your health care provider to see the bile moving from your liver to your gallbladder and to your small intestine. ? MRI.  Blood tests, such as: ? A complete blood count, because the white blood cell count may be higher than normal. ? Liver function tests, because some levels may be higher than normal with certain types of gallstones.  How is this treated? Treatment may include:  Fasting for a certain amount of time.  IV fluids.  Medicine to treat pain or vomiting.  Antibiotic medicine.  Surgery to remove your gallbladder (cholecystectomy). This may happen immediately or at a later time.  Follow these instructions at home: Home care will depend on your treatment. In general:  Take over-the-counter and prescription medicines only as told by your health care provider.  If you were prescribed an antibiotic medicine, take it as told by your health care provider. Do not stop taking the antibiotic even if you start to feel better.  Follow instructions from your health care provider about what to eat or drink. When you are allowed to eat, avoid eating or drinking anything that triggers your symptoms.  Keep all follow-up visits as told by your health  care provider. This is important.  Contact a health care provider if:  Your pain is not controlled with medicine.  You have a fever. Get help right away if:  Your pain moves to another part of your abdomen or to your back.  You continue to have symptoms or you develop new symptoms even with treatment. This information is not intended  to replace advice given to you by your health care provider. Make sure you discuss any questions you have with your health care provider. Document Released: 05/19/2005 Document Revised: 09/27/2015 Document Reviewed: 08/30/2014 Elsevier Interactive Patient Education  2018 [employer].

## 2018-04-27 NOTE — Consult Note (Signed)
Reason for Consult: Cholecystitis Referring Physician: Dr. Marshell Garfinkel Allison Knox is an 47 y.o. female.  HPI: Patient is a 47 year old white female who presented to the emergency room after being seen in an urgent care center for right upper quadrant abdominal pain and nausea.  Patient had an episode of emesis after eating a salad and experienced upper abdominal pain.  CT scan of the abdomen done in the emergency room revealed cholecystitis.  She did have a slight leukocytosis.  Her liver enzyme tests were within normal limits.  She was brought into the hospital for further management and treatment.  Overnight, her abdominal pain has resolved.  She currently has 0 out of 10 abdominal pain.  She has no nausea or vomiting.  She states she has not had an episode like this before.  She denies any fever, chills, or fatty food intolerance.  She denies any jaundice.  Past Medical History:  Diagnosis Date  . Abnormal vaginal bleeding   . Asthma    seasonal  . Ovarian cyst     Past Surgical History:  Procedure Laterality Date  . HAND SURGERY    . HERNIA REPAIR    . OVARIAN CYST REMOVAL      Family History  Problem Relation Age of Onset  . Heart failure Mother   . Dementia Mother   . Diabetes Mother   . Hypertension Mother   . Lung cancer Father   . Hypertension Brother   . Rheum arthritis Brother   . Neuromuscular disorder Neg Hx     Social History:  reports that she has quit smoking. She has never used smokeless tobacco. She reports that she drinks alcohol. She reports that she does not use drugs.  Allergies:  Allergies  Allergen Reactions  . Penicillins Rash    Has patient had a PCN reaction causing immediate rash, facial/tongue/throat swelling, SOB or lightheadedness with hypotension: Yes Has patient had a PCN reaction causing severe rash involving mucus membranes or skin necrosis: No Has patient had a PCN reaction that required hospitalization: No Has patient had a PCN reaction  occurring within the last 10 years: No If all of the above answers are "NO", then may proceed with Cephalosporin use.   . Codeine     Makes her "hyper"    Medications: Allison have reviewed the patient's current medications.  Results for orders placed or performed during the hospital encounter of 04/26/18 (from the past 48 hour(s))  Urinalysis, Routine w reflex microscopic     Status: Abnormal   Collection Time: 04/26/18  6:02 PM  Result Value Ref Range   Color, Urine YELLOW YELLOW   APPearance CLEAR CLEAR   Specific Gravity, Urine 1.014 1.005 - 1.030   pH 5.0 5.0 - 8.0   Glucose, UA NEGATIVE NEGATIVE mg/dL   Hgb urine dipstick NEGATIVE NEGATIVE   Bilirubin Urine NEGATIVE NEGATIVE   Ketones, ur 20 (A) NEGATIVE mg/dL   Protein, ur NEGATIVE NEGATIVE mg/dL   Nitrite NEGATIVE NEGATIVE   Leukocytes, UA NEGATIVE NEGATIVE    Comment: Performed at [facility], [address], [place], [place] [address]  Pregnancy, urine     Status: None   Collection Time: 04/26/18  6:02 PM  Result Value Ref Range   Preg Test, Ur NEGATIVE NEGATIVE    Comment:        THE SENSITIVITY OF THIS METHODOLOGY IS >20 mIU/mL. Performed at [facility], [address] [place], [place] [address]   Lipase, blood  Status: None   Collection Time: 04/26/18  6:16 PM  Result Value Ref Range   Lipase 36 11 - 51 U/L    Comment: Performed at [facility], [address] [place], [place] [address]  Comprehensive metabolic panel     Status: Abnormal   Collection Time: 04/26/18  6:16 PM  Result Value Ref Range   Sodium 134 (L) 135 - 145 mmol/L   Potassium 3.6 3.5 - 5.1 mmol/L   Chloride 100 98 - 111 mmol/L   CO2 22 22 - 32 mmol/L   Glucose, Bld 161 (H) 70 - 99 mg/dL   BUN 12 6 - 20 mg/dL   Creatinine, Ser 0.86 0.44 - 1.00 mg/dL   Calcium 9.1 8.9 - 10.3 mg/dL   Total Protein 8.5 (H) 6.5 - 8.1 g/dL   Albumin 4.6 3.5 - 5.0 g/dL   AST 23 15 - 41 U/L   ALT 19 0 - 44 U/L   Alkaline Phosphatase 90 38 - 126 U/L   Total  Bilirubin 0.7 0.3 - 1.2 mg/dL   GFR calc non Af Amer >60 >60 mL/min   GFR calc Af Amer >60 >60 mL/min    Comment: (NOTE) The eGFR has been calculated using the CKD EPI equation. This calculation has not been validated in all clinical situations. eGFR's persistently <60 mL/min signify possible Chronic Kidney Disease.    Anion gap 12 5 - 15    Comment: Performed at [facility], [address] [place], [place] [address]  CBC     Status: Abnormal   Collection Time: 04/26/18  6:16 PM  Result Value Ref Range   WBC 14.0 (H) 4.0 - 10.5 K/uL   RBC 4.49 3.87 - 5.11 MIL/uL   Hemoglobin 12.9 12.0 - 15.0 g/dL   HCT 40.6 36.0 - 46.0 %   MCV 90.4 80.0 - 100.0 fL   MCH 28.7 26.0 - 34.0 pg   MCHC 31.8 30.0 - 36.0 g/dL   RDW 13.3 11.5 - 15.5 %   Platelets 346 150 - 400 K/uL   nRBC 0.0 0.0 - 0.2 %    Comment: Performed at [facility], [address], [place], [place] [address]  Troponin Allison - Once     Status: None   Collection Time: 04/26/18 10:18 PM  Result Value Ref Range   Troponin Allison <0.03 <0.03 ng/mL    Comment: Performed at [facility], [address], [place], [place] [address]  Magnesium     Status: None   Collection Time: 04/26/18 10:18 PM  Result Value Ref Range   Magnesium 2.4 1.7 - 2.4 mg/dL    Comment: Performed at [facility], [address] [place], [place] [address]  Phosphorus     Status: None   Collection Time: 04/26/18 10:18 PM  Result Value Ref Range   Phosphorus 4.6 2.5 - 4.6 mg/dL    Comment: Performed at [facility], [address], [place], [place] [address]  Comprehensive metabolic panel     Status: Abnormal   Collection Time: 04/27/18  4:46 AM  Result Value Ref Range   Sodium 138 135 - 145 mmol/L   Potassium 3.3 (L) 3.5 - 5.1 mmol/L   Chloride 106 98 - 111 mmol/L   CO2 24 22 - 32 mmol/L   Glucose, Bld 137 (H) 70 - 99 mg/dL   BUN 10 6 - 20 mg/dL   Creatinine, Ser 0.82 0.44 - 1.00 mg/dL   Calcium 8.4 (L) 8.9 - 10.3 mg/dL  Total Protein 6.7 6.5 - 8.1 g/dL    Albumin 3.7 3.5 - 5.0 g/dL   AST 16 15 - 41 U/L   ALT 15 0 - 44 U/L   Alkaline Phosphatase 70 38 - 126 U/L   Total Bilirubin 0.5 0.3 - 1.2 mg/dL   GFR calc non Af Amer >60 >60 mL/min   GFR calc Af Amer >60 >60 mL/min    Comment: (NOTE) The eGFR has been calculated using the CKD EPI equation. This calculation has not been validated in all clinical situations. eGFR's persistently <60 mL/min signify possible Chronic Kidney Disease.    Anion gap 8 5 - 15    Comment: Performed at [facility], [address], [place], [place] [address]  CBC WITH DIFFERENTIAL     Status: Abnormal   Collection Time: 04/27/18  4:46 AM  Result Value Ref Range   WBC 7.6 4.0 - 10.5 K/uL   RBC 3.92 3.87 - 5.11 MIL/uL   Hemoglobin 11.0 (L) 12.0 - 15.0 g/dL   HCT 35.1 (L) 36.0 - 46.0 %   MCV 89.5 80.0 - 100.0 fL   MCH 28.1 26.0 - 34.0 pg   MCHC 31.3 30.0 - 36.0 g/dL   RDW 13.6 11.5 - 15.5 %   Platelets 300 150 - 400 K/uL   nRBC 0.0 0.0 - 0.2 %   Neutrophils Relative % 62 %   Neutro Abs 4.7 1.7 - 7.7 K/uL   Lymphocytes Relative 25 %   Lymphs Abs 1.9 0.7 - 4.0 K/uL   Monocytes Relative 6 %   Monocytes Absolute 0.5 0.1 - 1.0 K/uL   Eosinophils Relative 6 %   Eosinophils Absolute 0.5 0.0 - 0.5 K/uL   Basophils Relative 1 %   Basophils Absolute 0.0 0.0 - 0.1 K/uL   Immature Granulocytes 0 %   Abs Immature Granulocytes 0.01 0.00 - 0.07 K/uL    Comment: Performed at [facility], [address] [place], [place] [address]    Dg Chest 2 View  Result Date: 04/26/2018 CLINICAL DATA:  Chest and epigastric pain beginning today. Asthma. Former smoker. EXAM: CHEST - 2 VIEW COMPARISON:  07/08/2010 FINDINGS: The heart size and mediastinal contours are within normal limits. Both lungs are clear. The visualized skeletal structures are unremarkable. IMPRESSION: No active cardiopulmonary disease. Electronically Signed   By: Earle Gell M.D.   On: 04/26/2018 23:31   Ct Abdomen Pelvis W Contrast  Result Date:  04/26/2018 CLINICAL DATA:  Upper abdominal pain with nausea vomiting. EXAM: CT ABDOMEN AND PELVIS WITH CONTRAST TECHNIQUE: Multidetector CT imaging of the abdomen and pelvis was performed using the standard protocol following bolus administration of intravenous contrast. CONTRAST:  128m ISOVUE-300 IOPAMIDOL (ISOVUE-300) INJECTION 61% COMPARISON:  07/09/2010 FINDINGS: Lower chest: No acute abnormality. Hepatobiliary: No focal liver abnormality is seen. Mild hyperenhancement of the gallbladder wall with pericholecystic fluid. Pancreas: Unremarkable. No pancreatic ductal dilatation or surrounding inflammatory changes. Spleen: Normal in size without focal abnormality. Adrenals/Urinary Tract: Adrenal glands are unremarkable. Kidneys are without without renal calculi, or hydronephrosis. 8 mm too small to be actually characterized hypoattenuated lesion in the cortex of the midpole region of the left kidney. Bladder is unremarkable. Stomach/Bowel: Stomach is within normal limits. Appendix appears normal. No evidence of bowel wall thickening, distention, or inflammatory changes. Scattered colonic diverticulosis. Vascular/Lymphatic: Mild aortic atherosclerosis. No enlarged abdominal or pelvic lymph nodes. Reproductive: Uterus and bilateral adnexa are unremarkable. Other: No abdominal wall hernia or abnormality. No abdominopelvic ascites. Musculoskeletal: No acute  or significant osseous findings. IMPRESSION: Hyperenhancement of the gallbladder wall with pericholecystic fluid. In the appropriate clinical setting, these findings are suggestive of acute cholecystitis. Confirmation with right upper quadrant ultrasound may be considered. Scattered colonic diverticulosis. Electronically Signed   By: Fidela Salisbury M.D.   On: 04/26/2018 23:16    ROS:  Pertinent items are noted in HPI.  Blood pressure 114/73, pulse 70, temperature 97.8 F (36.6 C), temperature source Other (Comment), resp. rate 18, height '5\' 7"'$  (1.702  m), weight 80.3 kg, last menstrual period 03/19/2018, SpO2 99 %. Physical Exam: Pleasant well-developed well-nourished white female no acute distress Head is normocephalic, atraumatic Eyes without scleral icterus Lungs clear to auscultation with equal breath sounds bilaterally Heart examination reveals regular rate and rhythm without S3, S4, murmurs Abdomen is soft, nontender, nondistended.  No hepatosplenomegaly, masses, hernias are identified.  No rigidity is noted.  CT scan report reviewed  Assessment/Plan: Impression: Biliary colic secondary to cholecystitis, currently asymptomatic Leukocytosis has resolved.  Liver enzyme test continue to be within normal limits. Plan: For ultrasound this morning.  Pending any significant findings other than cholelithiasis, patient would like to go home which is fine with me.  No need for acute surgical intervention at this time.  Allison Knox 04/27/2018, 7:46 AM

## 2018-04-28 LAB — HIV ANTIBODY (ROUTINE TESTING W REFLEX): HIV SCREEN 4TH GENERATION: NONREACTIVE

## 2018-05-04 DIAGNOSIS — K810 Acute cholecystitis: Secondary | ICD-10-CM | POA: Diagnosis not present

## 2018-05-06 ENCOUNTER — Other Ambulatory Visit (HOSPITAL_COMMUNITY): Payer: Self-pay | Admitting: Family Medicine

## 2018-05-06 DIAGNOSIS — K810 Acute cholecystitis: Secondary | ICD-10-CM

## 2018-05-21 ENCOUNTER — Encounter (HOSPITAL_COMMUNITY)
Admission: RE | Admit: 2018-05-21 | Discharge: 2018-05-21 | Disposition: A | Discharge status: Home / Self Care | Payer: BLUE CROSS/BLUE SHIELD | Source: Ambulatory Visit | Attending: Family Medicine | Admitting: Family Medicine

## 2018-05-21 DIAGNOSIS — R1011 Right upper quadrant pain: Secondary | ICD-10-CM | POA: Diagnosis not present

## 2018-05-21 DIAGNOSIS — L509 Urticaria, unspecified: Secondary | ICD-10-CM | POA: Diagnosis not present

## 2018-05-21 DIAGNOSIS — K810 Acute cholecystitis: Secondary | ICD-10-CM | POA: Diagnosis not present

## 2018-05-21 MED ORDER — TECHNETIUM TC 99M MEBROFENIN IV KIT
5.3000 | PACK | Freq: Once | INTRAVENOUS | Status: AC
  Administered 2018-05-21: 5.3 via INTRAVENOUS

## 2018-05-31 DIAGNOSIS — R109 Unspecified abdominal pain: Secondary | ICD-10-CM | POA: Diagnosis not present

## 2018-06-03 DIAGNOSIS — J019 Acute sinusitis, unspecified: Secondary | ICD-10-CM | POA: Diagnosis not present

## 2018-06-03 DIAGNOSIS — H6591 Unspecified nonsuppurative otitis media, right ear: Secondary | ICD-10-CM | POA: Diagnosis not present

## 2018-06-09 DIAGNOSIS — J4520 Mild intermittent asthma, uncomplicated: Secondary | ICD-10-CM | POA: Diagnosis not present

## 2018-06-09 DIAGNOSIS — J3081 Allergic rhinitis due to animal (cat) (dog) hair and dander: Secondary | ICD-10-CM | POA: Diagnosis not present

## 2018-06-09 DIAGNOSIS — J301 Allergic rhinitis due to pollen: Secondary | ICD-10-CM | POA: Diagnosis not present

## 2018-06-09 DIAGNOSIS — J3089 Other allergic rhinitis: Secondary | ICD-10-CM | POA: Diagnosis not present

## 2018-06-28 DIAGNOSIS — J019 Acute sinusitis, unspecified: Secondary | ICD-10-CM | POA: Diagnosis not present

## 2018-10-21 DIAGNOSIS — M25561 Pain in right knee: Secondary | ICD-10-CM | POA: Diagnosis not present

## 2018-10-21 DIAGNOSIS — M542 Cervicalgia: Secondary | ICD-10-CM | POA: Diagnosis not present

## 2018-11-01 DIAGNOSIS — M542 Cervicalgia: Secondary | ICD-10-CM | POA: Diagnosis not present

## 2018-11-02 DIAGNOSIS — M25561 Pain in right knee: Secondary | ICD-10-CM | POA: Diagnosis not present

## 2018-11-08 DIAGNOSIS — M542 Cervicalgia: Secondary | ICD-10-CM | POA: Diagnosis not present

## 2018-11-17 DIAGNOSIS — M542 Cervicalgia: Secondary | ICD-10-CM | POA: Diagnosis not present

## 2018-11-23 DIAGNOSIS — M542 Cervicalgia: Secondary | ICD-10-CM | POA: Diagnosis not present

## 2018-11-26 DIAGNOSIS — Z01419 Encounter for gynecological examination (general) (routine) without abnormal findings: Secondary | ICD-10-CM | POA: Diagnosis not present

## 2018-11-26 DIAGNOSIS — Z6828 Body mass index (BMI) 28.0-28.9, adult: Secondary | ICD-10-CM | POA: Diagnosis not present

## 2018-11-26 DIAGNOSIS — Z1231 Encounter for screening mammogram for malignant neoplasm of breast: Secondary | ICD-10-CM | POA: Diagnosis not present

## 2018-11-30 DIAGNOSIS — M542 Cervicalgia: Secondary | ICD-10-CM | POA: Diagnosis not present

## 2019-01-18 ENCOUNTER — Other Ambulatory Visit: Payer: Self-pay | Admitting: Family Medicine

## 2019-01-18 DIAGNOSIS — R1011 Right upper quadrant pain: Secondary | ICD-10-CM | POA: Diagnosis not present

## 2019-01-19 DIAGNOSIS — R1011 Right upper quadrant pain: Secondary | ICD-10-CM | POA: Diagnosis not present

## 2019-01-27 ENCOUNTER — Other Ambulatory Visit: Payer: Self-pay

## 2019-01-27 DIAGNOSIS — R6889 Other general symptoms and signs: Secondary | ICD-10-CM | POA: Diagnosis not present

## 2019-01-27 DIAGNOSIS — Z20822 Contact with and (suspected) exposure to covid-19: Secondary | ICD-10-CM

## 2019-01-29 LAB — NOVEL CORONAVIRUS, NAA: SARS-CoV-2, NAA: NOT DETECTED

## 2019-02-15 DIAGNOSIS — L219 Seborrheic dermatitis, unspecified: Secondary | ICD-10-CM | POA: Diagnosis not present

## 2019-03-31 IMAGING — CT CT ABD-PELV W/ CM
2 of 4 series · 16 of 46 positions shown, 18 images · IV contrast (Isovue)
Comparison: 07/09/2010

CLINICAL DATA: Upper abdominal pain with nausea vomiting.

EXAM:
CT ABDOMEN AND PELVIS WITH CONTRAST
TECHNIQUE: Multidetector CT imaging of the abdomen and pelvis was performed
using the standard protocol following bolus administration of
intravenous contrast.
CONTRAST:  100mL JSAG6T-IJJ IOPAMIDOL (JSAG6T-IJJ) INJECTION 61%

[Series 2: axial st · axial · 0.91mm/px · z∈[-498,-73]mm · 13 of 95 slices shown, 15 images]
[im 5/95  soft-tissue]
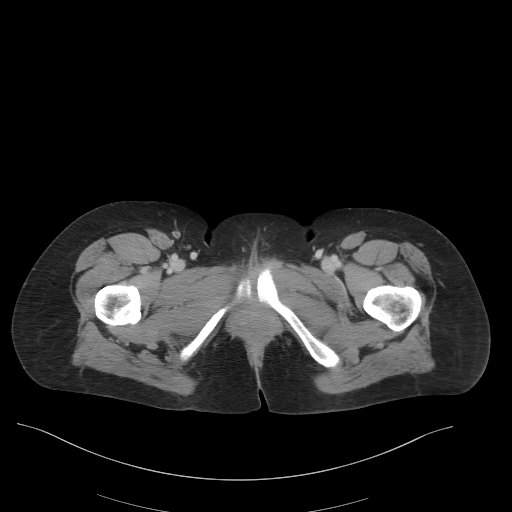
[im 5/95  bone]
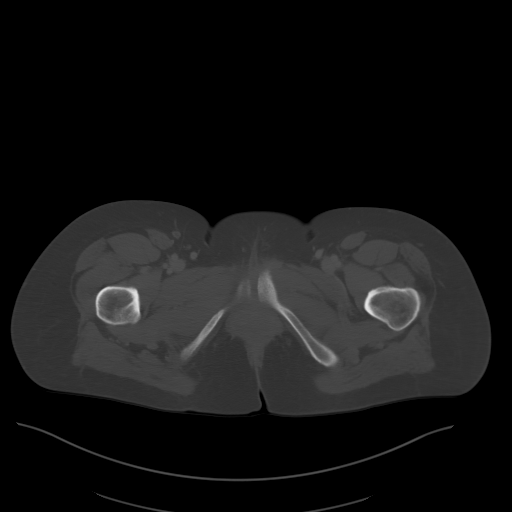
[im 13/95  soft-tissue]
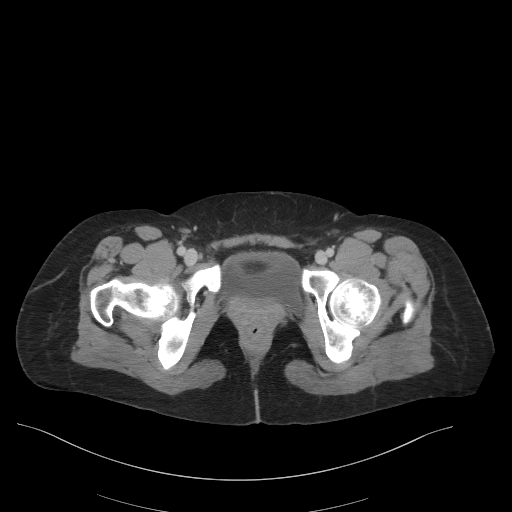
[im 21/95  soft-tissue]
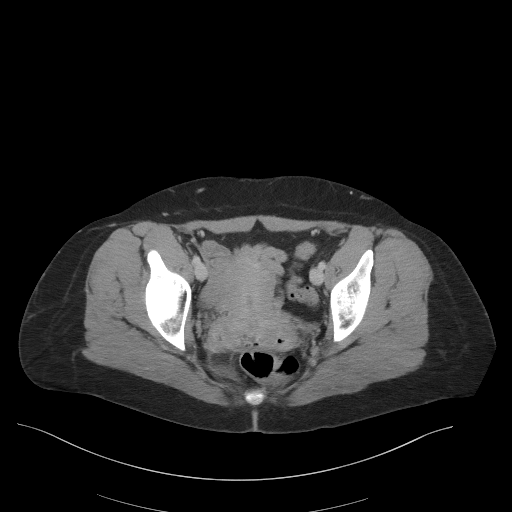
[im 25/95  soft-tissue]
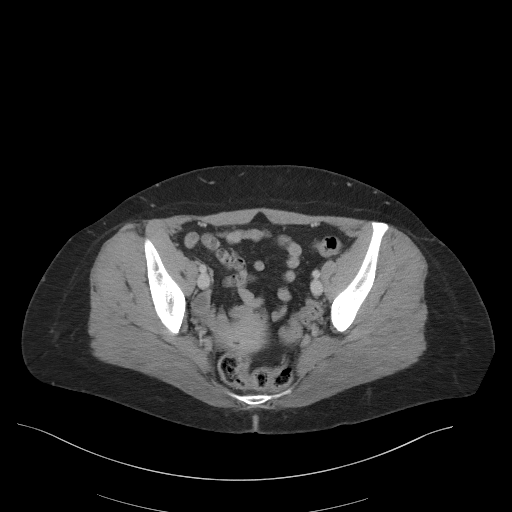
[im 33/95  soft-tissue]
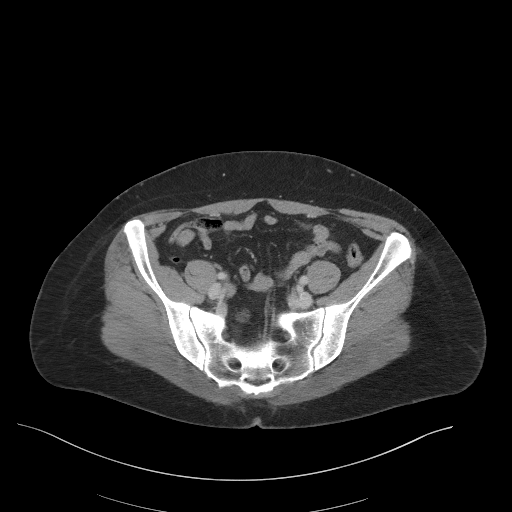
[im 41/95  soft-tissue]
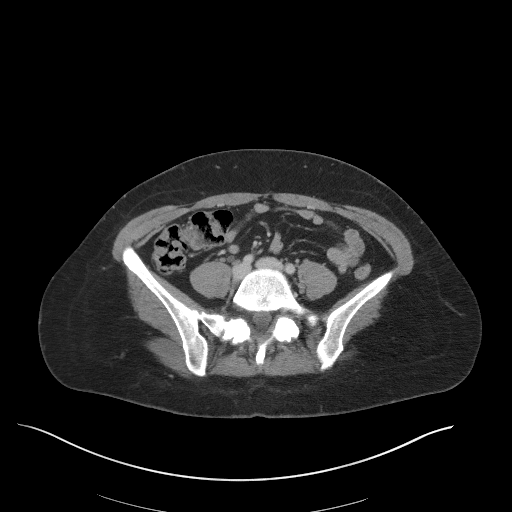
[im 50/95  soft-tissue]
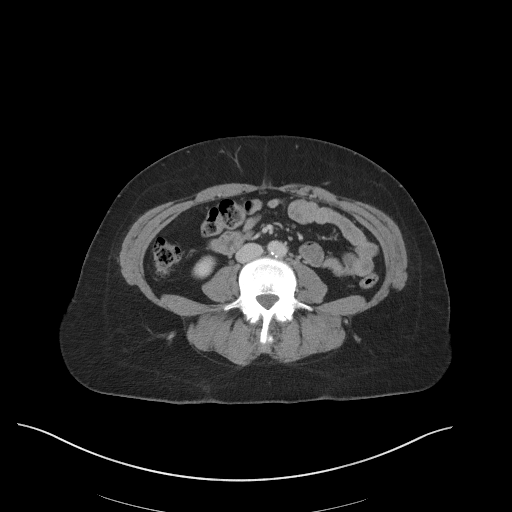
[im 54/95  soft-tissue]
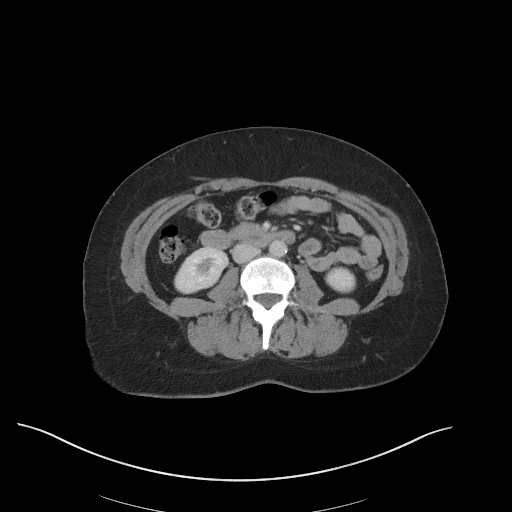
[im 62/95  soft-tissue]
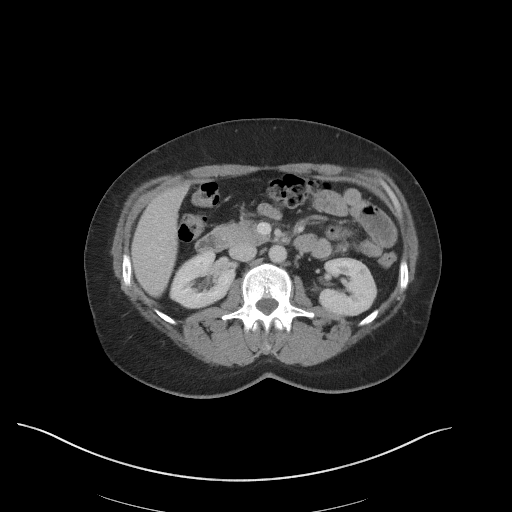
[im 62/95  bone]
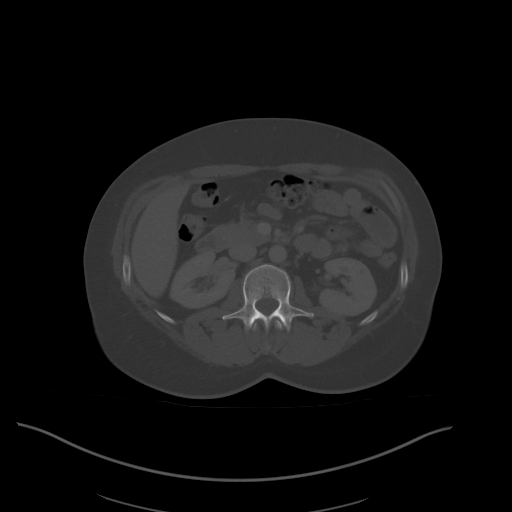
[im 70/95  soft-tissue]
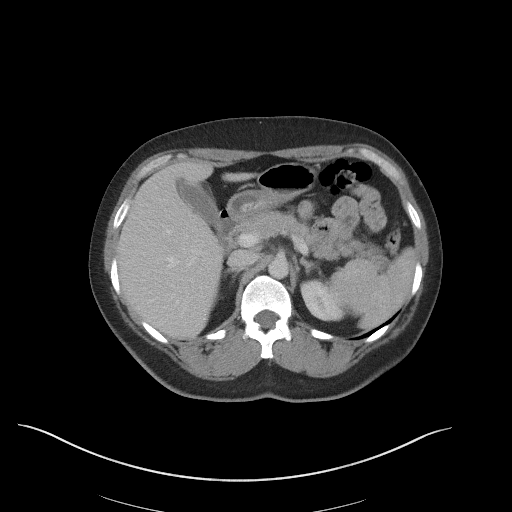
[im 74/95  soft-tissue]
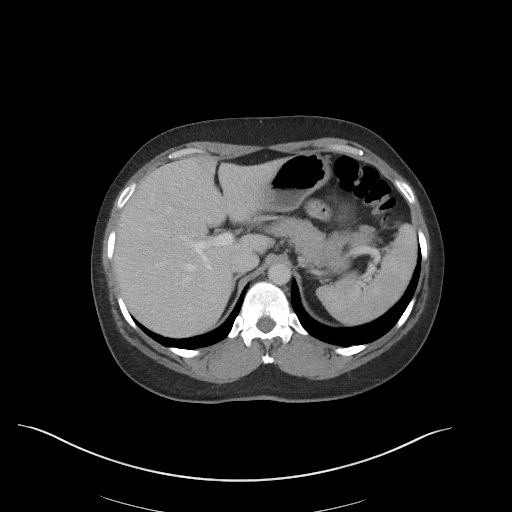
[im 82/95  soft-tissue]
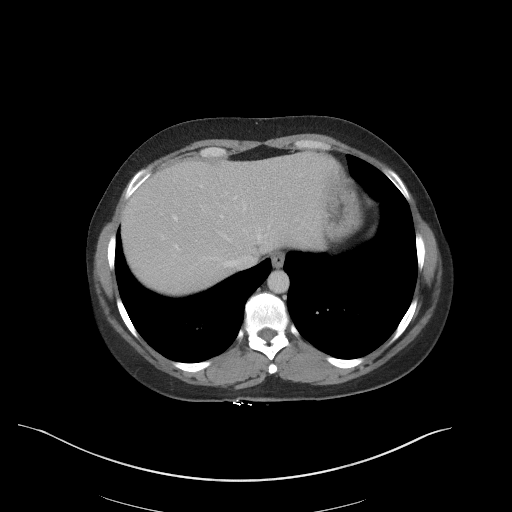
[im 90/95  soft-tissue]
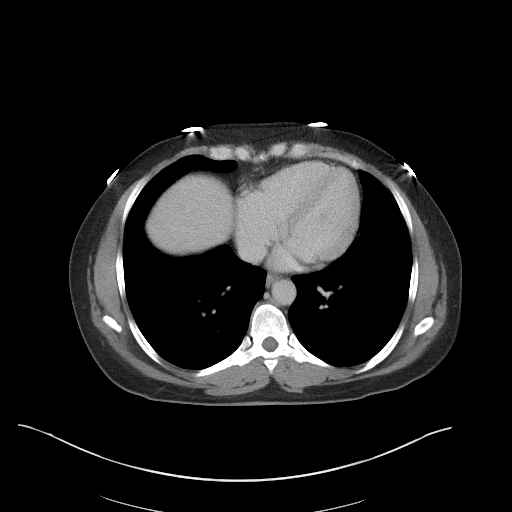

[Series 5: coronal st · coronal · 0.83mm/px · 3 of 82 slices shown]
[im 28/82  soft-tissue]
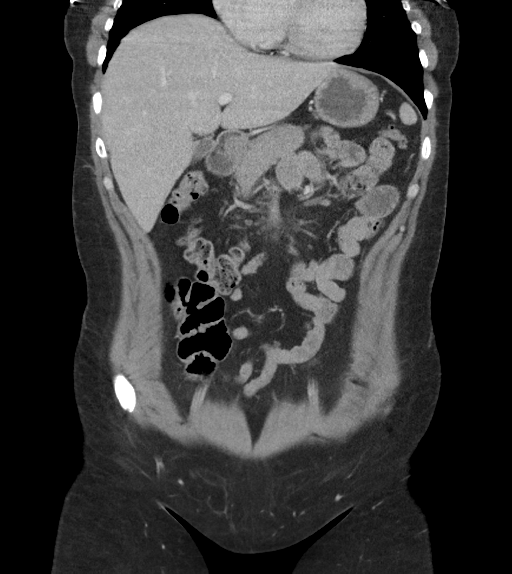
[im 37/82  soft-tissue]
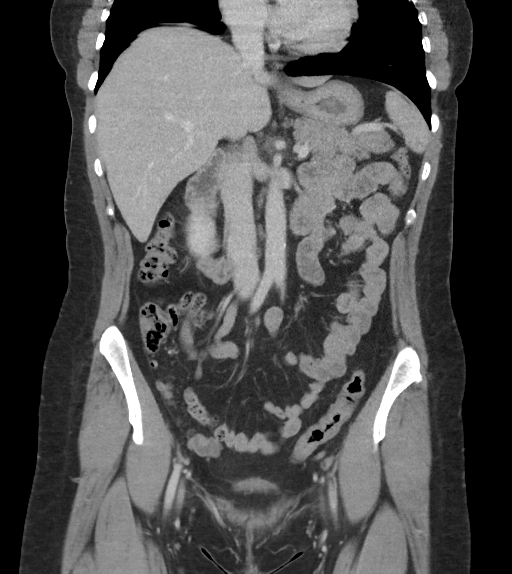
[im 46/82  soft-tissue]
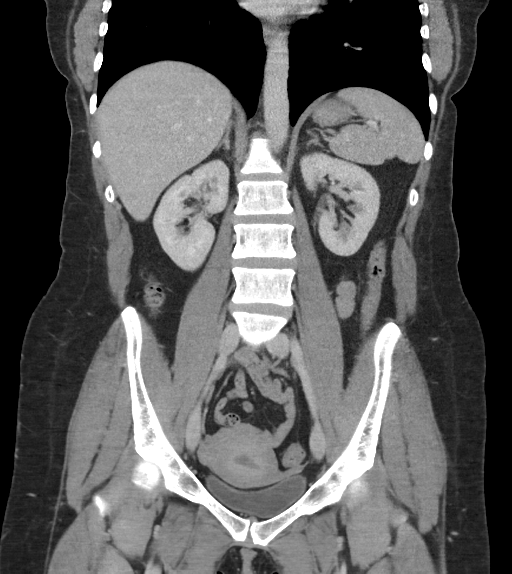

[16 of 46 positions shown; findings below may reference images not displayed]

FINDINGS: Lower chest: No acute abnormality.

Hepatobiliary: No focal liver abnormality is seen. Mild
hyperenhancement of the gallbladder wall with pericholecystic fluid.

Pancreas: Unremarkable. No pancreatic ductal dilatation or
surrounding inflammatory changes.

Spleen: Normal in size without focal abnormality.

Adrenals/Urinary Tract: Adrenal glands are unremarkable. Kidneys are
without without renal calculi, or hydronephrosis. 8 mm too small to
be actually characterized hypoattenuated lesion in the cortex of the
midpole region of the left kidney. Bladder is unremarkable.

Stomach/Bowel: Stomach is within normal limits. Appendix appears
normal. No evidence of bowel wall thickening, distention, or
inflammatory changes. Scattered colonic diverticulosis.

Vascular/Lymphatic: Mild aortic atherosclerosis. No enlarged
abdominal or pelvic lymph nodes.

Reproductive: Uterus and bilateral adnexa are unremarkable.

Other: No abdominal wall hernia or abnormality. No abdominopelvic
ascites.

Musculoskeletal: No acute or significant osseous findings.
IMPRESSION: Hyperenhancement of the gallbladder wall with pericholecystic fluid.
In the appropriate clinical setting, these findings are suggestive
of acute cholecystitis. Confirmation with right upper quadrant
ultrasound may be considered.

Scattered colonic diverticulosis.

## 2019-04-01 IMAGING — US US ABDOMEN LIMITED
1 series · 14 of 25 positions shown · non-contrast
Comparison: CT from the previous day

CLINICAL DATA: Upper abdominal pain with abnormal gallbladder on
recent CT

EXAM:
ULTRASOUND ABDOMEN LIMITED RIGHT UPPER QUADRANT

[Series 1: us abdomen limited · 14 of 57 slices shown]
[im 1/57]
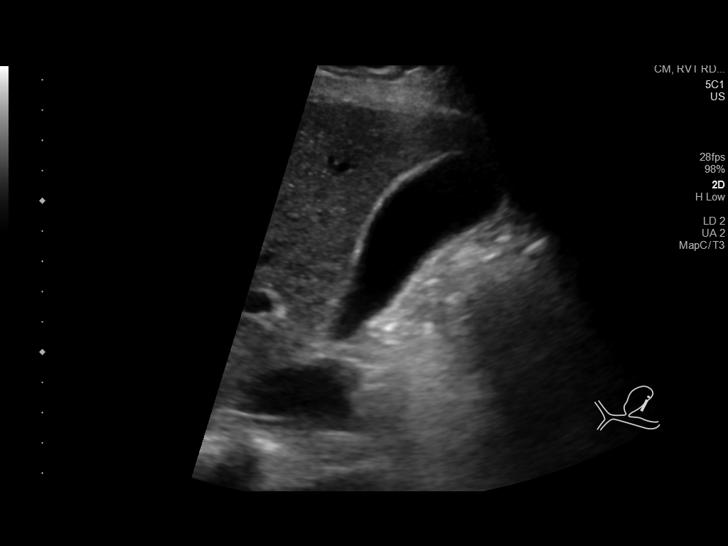
[im 5/57]
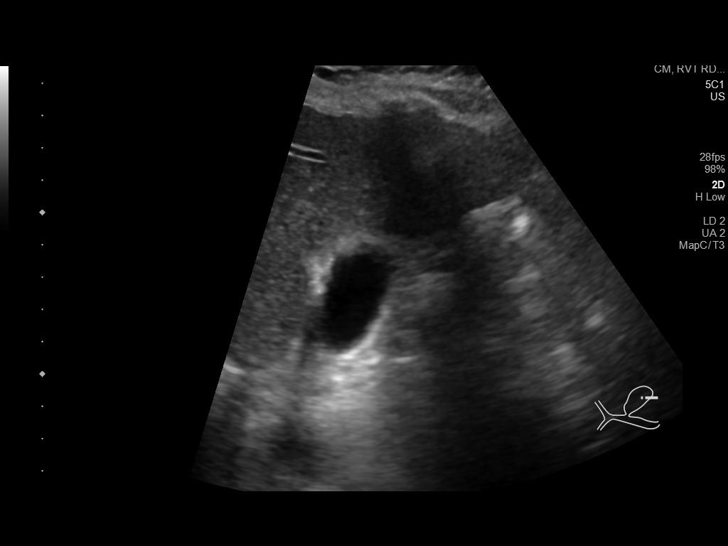
[im 10/57]
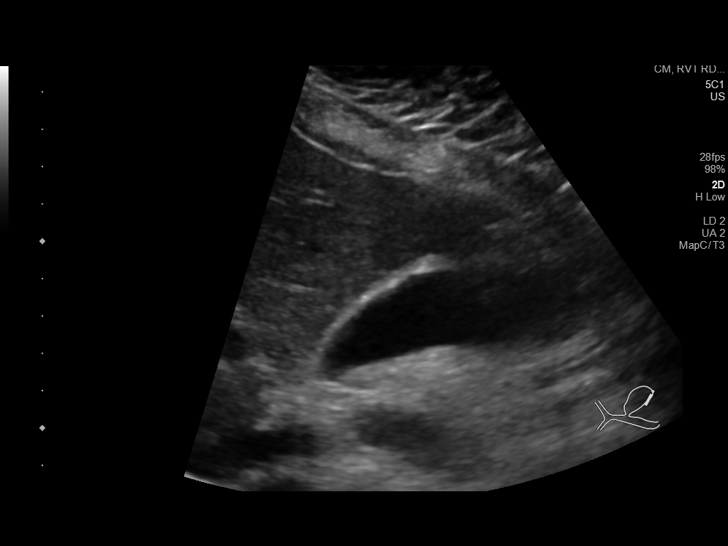
[im 15/57]
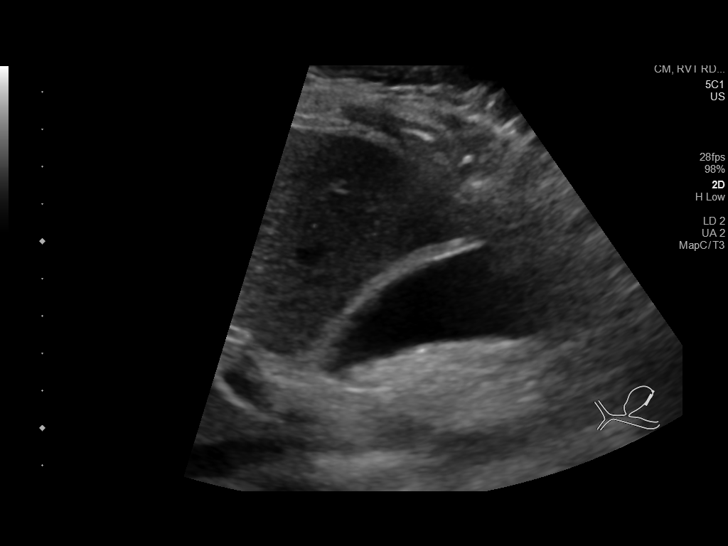
[im 19/57]
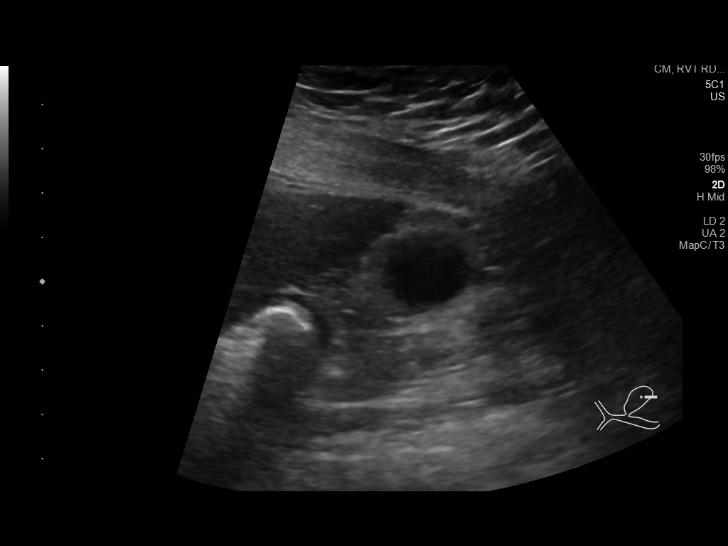
[im 22/57]
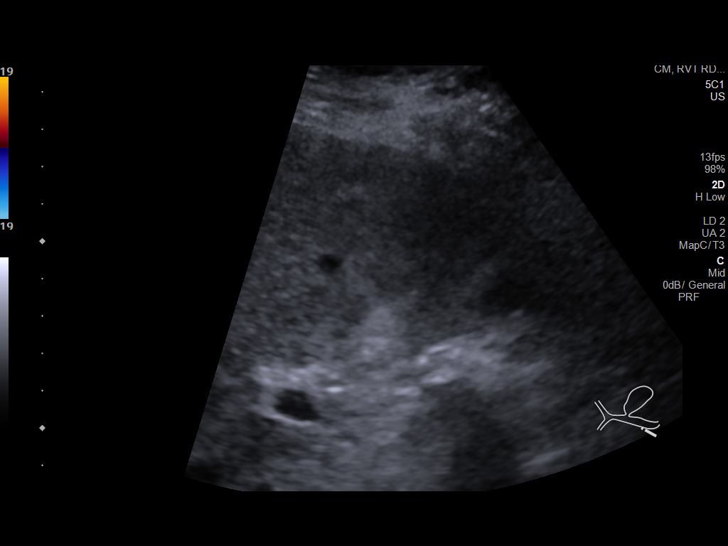
[im 26/57]
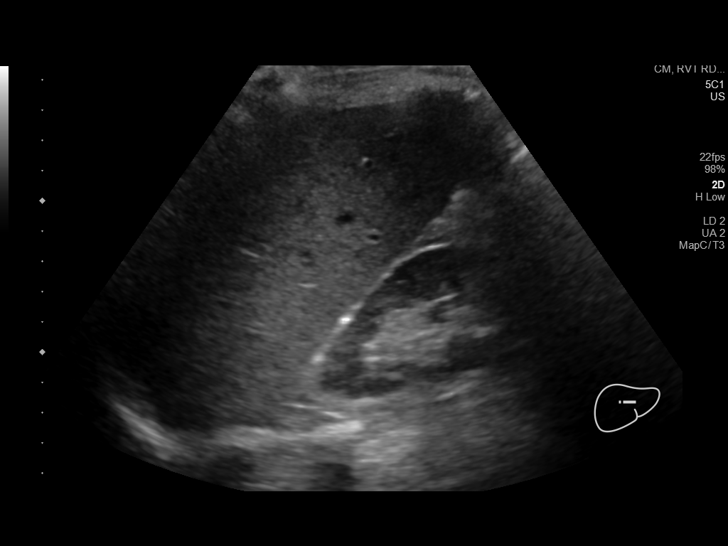
[im 31/57]
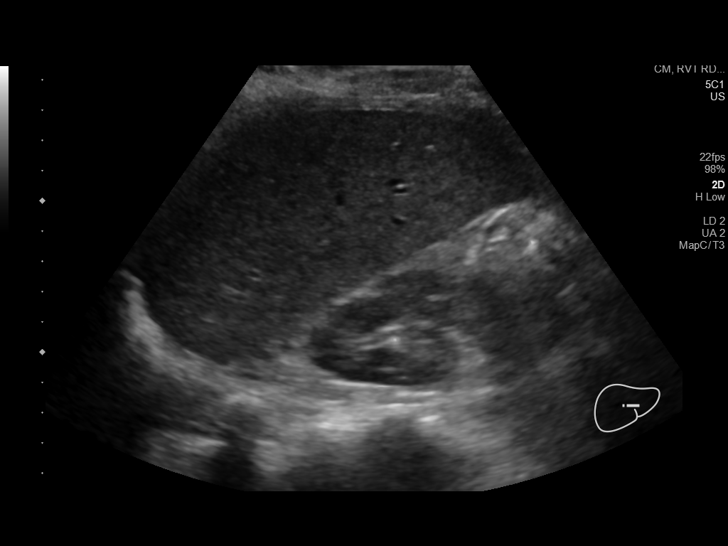
[im 36/57]
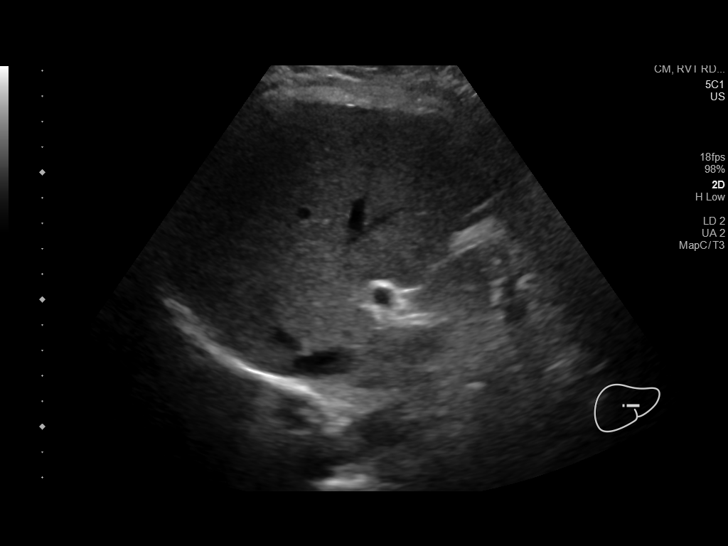
[im 38/57]
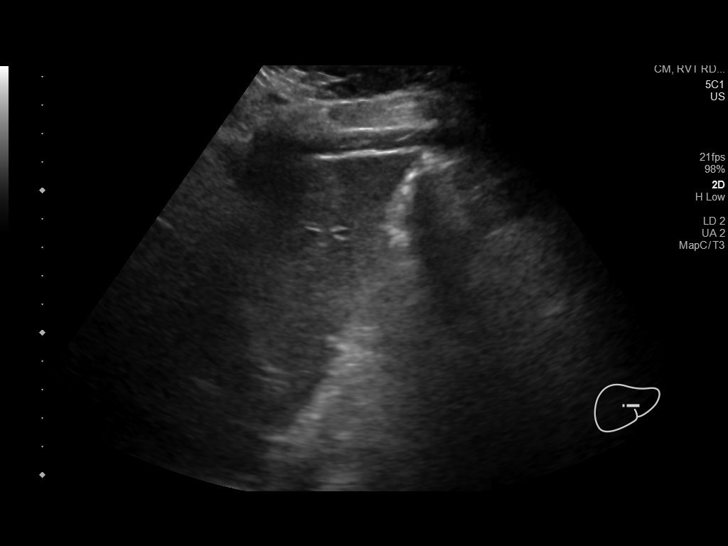
[im 43/57]
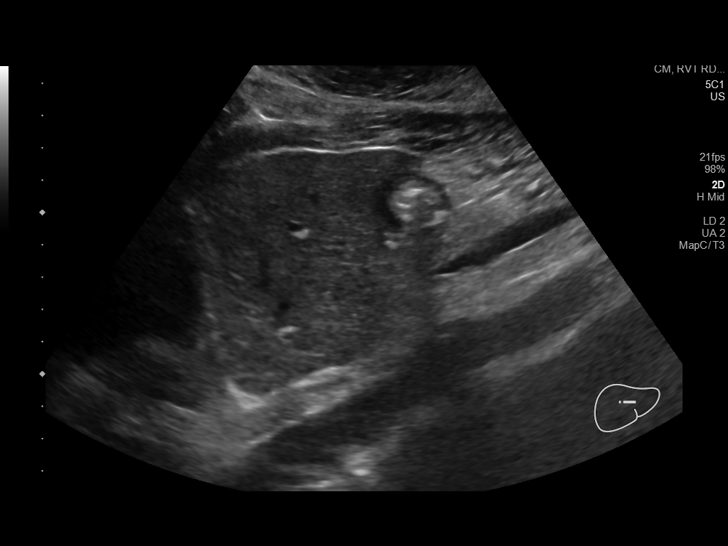
[im 47/57]
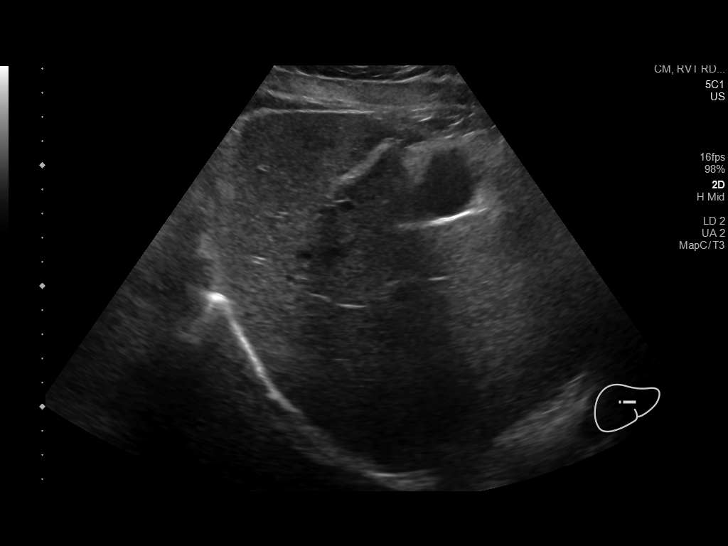
[im 52/57]
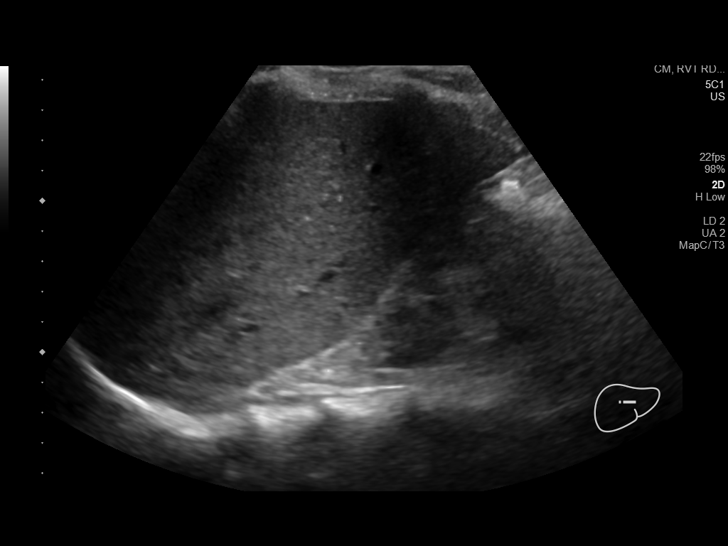
[im 57/57]
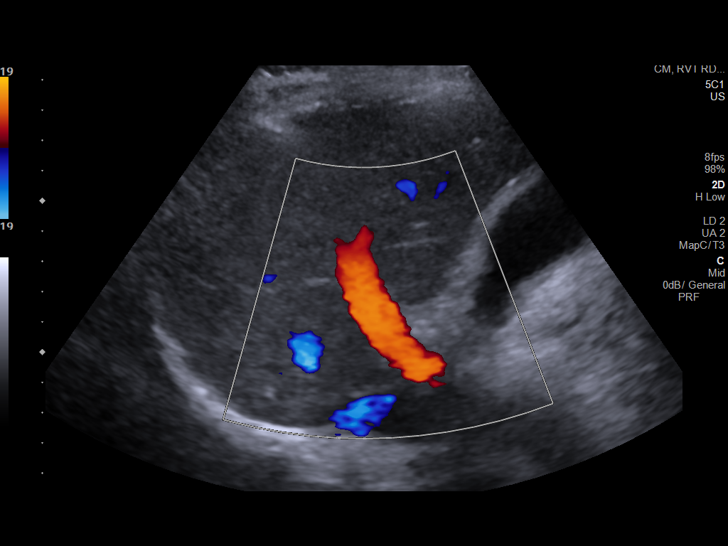

[14 of 25 positions shown; findings below may reference images not displayed]

FINDINGS: Gallbladder:

Gallbladder is well distended. Gallbladder wall is thickened to
mm. This is consistent with that seen on recent CT examination. No
cholelithiasis is noted. No pericholecystic fluid is seen. Negative
sonographic Murphy sign is elicited.

Common bile duct:

Diameter: 2.7 mm.

Liver:

No focal lesion identified. Within normal limits in parenchymal
echogenicity. Portal vein is patent on color Doppler imaging with
normal direction of blood flow towards the liver.
IMPRESSION: Gallbladder wall thickening without pericholecystic fluid and
negative sonographic Murphy sign. These changes may be related to a
more chronic cholecystitis. Nonemergent HIDA scan may be helpful to
assess for cystic duct patency as well as gallbladder function.

## 2019-05-09 DIAGNOSIS — K8020 Calculus of gallbladder without cholecystitis without obstruction: Secondary | ICD-10-CM | POA: Diagnosis not present

## 2019-05-10 DIAGNOSIS — K8020 Calculus of gallbladder without cholecystitis without obstruction: Secondary | ICD-10-CM | POA: Diagnosis not present

## 2019-06-07 DIAGNOSIS — J3089 Other allergic rhinitis: Secondary | ICD-10-CM | POA: Diagnosis not present

## 2019-06-07 DIAGNOSIS — J301 Allergic rhinitis due to pollen: Secondary | ICD-10-CM | POA: Diagnosis not present

## 2019-06-07 DIAGNOSIS — J3081 Allergic rhinitis due to animal (cat) (dog) hair and dander: Secondary | ICD-10-CM | POA: Diagnosis not present

## 2019-06-07 DIAGNOSIS — J4520 Mild intermittent asthma, uncomplicated: Secondary | ICD-10-CM | POA: Diagnosis not present

## 2019-06-15 DIAGNOSIS — M9905 Segmental and somatic dysfunction of pelvic region: Secondary | ICD-10-CM | POA: Diagnosis not present

## 2019-06-15 DIAGNOSIS — M9901 Segmental and somatic dysfunction of cervical region: Secondary | ICD-10-CM | POA: Diagnosis not present

## 2019-06-15 DIAGNOSIS — M542 Cervicalgia: Secondary | ICD-10-CM | POA: Diagnosis not present

## 2019-06-15 DIAGNOSIS — M7061 Trochanteric bursitis, right hip: Secondary | ICD-10-CM | POA: Diagnosis not present

## 2019-06-17 DIAGNOSIS — M542 Cervicalgia: Secondary | ICD-10-CM | POA: Diagnosis not present

## 2019-06-17 DIAGNOSIS — M9905 Segmental and somatic dysfunction of pelvic region: Secondary | ICD-10-CM | POA: Diagnosis not present

## 2019-06-17 DIAGNOSIS — M7061 Trochanteric bursitis, right hip: Secondary | ICD-10-CM | POA: Diagnosis not present

## 2019-06-17 DIAGNOSIS — M9901 Segmental and somatic dysfunction of cervical region: Secondary | ICD-10-CM | POA: Diagnosis not present

## 2019-06-20 DIAGNOSIS — M7061 Trochanteric bursitis, right hip: Secondary | ICD-10-CM | POA: Diagnosis not present

## 2019-06-20 DIAGNOSIS — M545 Low back pain: Secondary | ICD-10-CM | POA: Diagnosis not present

## 2019-06-20 DIAGNOSIS — M5090 Cervical disc disorder, unspecified, unspecified cervical region: Secondary | ICD-10-CM | POA: Diagnosis not present

## 2019-06-24 DIAGNOSIS — M25561 Pain in right knee: Secondary | ICD-10-CM | POA: Diagnosis not present

## 2019-07-13 DIAGNOSIS — Z03818 Encounter for observation for suspected exposure to other biological agents ruled out: Secondary | ICD-10-CM | POA: Diagnosis not present

## 2019-07-14 DIAGNOSIS — Z20822 Contact with and (suspected) exposure to covid-19: Secondary | ICD-10-CM | POA: Diagnosis not present

## 2019-07-14 DIAGNOSIS — Z03818 Encounter for observation for suspected exposure to other biological agents ruled out: Secondary | ICD-10-CM | POA: Diagnosis not present

## 2019-07-27 DIAGNOSIS — M25561 Pain in right knee: Secondary | ICD-10-CM | POA: Diagnosis not present

## 2019-08-02 DIAGNOSIS — M25561 Pain in right knee: Secondary | ICD-10-CM | POA: Diagnosis not present

## 2019-08-02 DIAGNOSIS — M7061 Trochanteric bursitis, right hip: Secondary | ICD-10-CM | POA: Diagnosis not present

## 2019-08-03 DIAGNOSIS — M25561 Pain in right knee: Secondary | ICD-10-CM | POA: Diagnosis not present

## 2019-08-09 DIAGNOSIS — M25561 Pain in right knee: Secondary | ICD-10-CM | POA: Diagnosis not present

## 2019-08-09 DIAGNOSIS — M10061 Idiopathic gout, right knee: Secondary | ICD-10-CM | POA: Diagnosis not present

## 2019-08-25 DIAGNOSIS — M25561 Pain in right knee: Secondary | ICD-10-CM | POA: Diagnosis not present

## 2019-09-08 DIAGNOSIS — Z13220 Encounter for screening for lipoid disorders: Secondary | ICD-10-CM | POA: Diagnosis not present

## 2019-09-08 DIAGNOSIS — Z0000 Encounter for general adult medical examination without abnormal findings: Secondary | ICD-10-CM | POA: Diagnosis not present

## 2019-09-08 DIAGNOSIS — Z8249 Family history of ischemic heart disease and other diseases of the circulatory system: Secondary | ICD-10-CM | POA: Diagnosis not present

## 2019-09-08 DIAGNOSIS — E559 Vitamin D deficiency, unspecified: Secondary | ICD-10-CM | POA: Diagnosis not present

## 2019-09-19 DIAGNOSIS — L401 Generalized pustular psoriasis: Secondary | ICD-10-CM | POA: Diagnosis not present

## 2019-09-19 DIAGNOSIS — R5382 Chronic fatigue, unspecified: Secondary | ICD-10-CM | POA: Diagnosis not present

## 2019-09-19 DIAGNOSIS — M2550 Pain in unspecified joint: Secondary | ICD-10-CM | POA: Diagnosis not present

## 2019-10-03 DIAGNOSIS — M2550 Pain in unspecified joint: Secondary | ICD-10-CM | POA: Diagnosis not present

## 2019-10-03 DIAGNOSIS — R5382 Chronic fatigue, unspecified: Secondary | ICD-10-CM | POA: Diagnosis not present

## 2019-10-03 DIAGNOSIS — L401 Generalized pustular psoriasis: Secondary | ICD-10-CM | POA: Diagnosis not present

## 2019-10-03 DIAGNOSIS — L4059 Other psoriatic arthropathy: Secondary | ICD-10-CM | POA: Diagnosis not present

## 2019-10-17 DIAGNOSIS — F4323 Adjustment disorder with mixed anxiety and depressed mood: Secondary | ICD-10-CM | POA: Diagnosis not present

## 2019-11-04 DIAGNOSIS — D225 Melanocytic nevi of trunk: Secondary | ICD-10-CM | POA: Diagnosis not present

## 2019-11-04 DIAGNOSIS — D485 Neoplasm of uncertain behavior of skin: Secondary | ICD-10-CM | POA: Diagnosis not present

## 2019-11-04 DIAGNOSIS — L400 Psoriasis vulgaris: Secondary | ICD-10-CM | POA: Diagnosis not present

## 2019-11-04 DIAGNOSIS — F4323 Adjustment disorder with mixed anxiety and depressed mood: Secondary | ICD-10-CM | POA: Diagnosis not present

## 2019-11-04 DIAGNOSIS — L814 Other melanin hyperpigmentation: Secondary | ICD-10-CM | POA: Diagnosis not present

## 2019-11-04 DIAGNOSIS — L821 Other seborrheic keratosis: Secondary | ICD-10-CM | POA: Diagnosis not present

## 2019-11-04 DIAGNOSIS — L578 Other skin changes due to chronic exposure to nonionizing radiation: Secondary | ICD-10-CM | POA: Diagnosis not present

## 2019-11-18 DIAGNOSIS — F4323 Adjustment disorder with mixed anxiety and depressed mood: Secondary | ICD-10-CM | POA: Diagnosis not present

## 2019-11-24 DIAGNOSIS — T1511XA Foreign body in conjunctival sac, right eye, initial encounter: Secondary | ICD-10-CM | POA: Diagnosis not present

## 2019-12-02 DIAGNOSIS — Z124 Encounter for screening for malignant neoplasm of cervix: Secondary | ICD-10-CM | POA: Diagnosis not present

## 2019-12-02 DIAGNOSIS — Z01419 Encounter for gynecological examination (general) (routine) without abnormal findings: Secondary | ICD-10-CM | POA: Diagnosis not present

## 2019-12-02 DIAGNOSIS — F4323 Adjustment disorder with mixed anxiety and depressed mood: Secondary | ICD-10-CM | POA: Diagnosis not present

## 2019-12-02 DIAGNOSIS — Z6827 Body mass index (BMI) 27.0-27.9, adult: Secondary | ICD-10-CM | POA: Diagnosis not present

## 2019-12-02 DIAGNOSIS — Z1231 Encounter for screening mammogram for malignant neoplasm of breast: Secondary | ICD-10-CM | POA: Diagnosis not present

## 2019-12-29 DIAGNOSIS — F4323 Adjustment disorder with mixed anxiety and depressed mood: Secondary | ICD-10-CM | POA: Diagnosis not present

## 2020-01-12 DIAGNOSIS — U071 COVID-19: Secondary | ICD-10-CM | POA: Diagnosis not present

## 2020-01-13 DIAGNOSIS — U071 COVID-19: Secondary | ICD-10-CM | POA: Diagnosis not present

## 2020-01-26 DIAGNOSIS — F4323 Adjustment disorder with mixed anxiety and depressed mood: Secondary | ICD-10-CM | POA: Diagnosis not present

## 2020-02-24 DIAGNOSIS — F4323 Adjustment disorder with mixed anxiety and depressed mood: Secondary | ICD-10-CM | POA: Diagnosis not present

## 2020-02-29 DIAGNOSIS — M2550 Pain in unspecified joint: Secondary | ICD-10-CM | POA: Diagnosis not present

## 2020-02-29 DIAGNOSIS — L4059 Other psoriatic arthropathy: Secondary | ICD-10-CM | POA: Diagnosis not present

## 2020-02-29 DIAGNOSIS — L401 Generalized pustular psoriasis: Secondary | ICD-10-CM | POA: Diagnosis not present

## 2020-05-24 DIAGNOSIS — L401 Generalized pustular psoriasis: Secondary | ICD-10-CM | POA: Diagnosis not present

## 2020-05-24 DIAGNOSIS — M2550 Pain in unspecified joint: Secondary | ICD-10-CM | POA: Diagnosis not present

## 2020-05-24 DIAGNOSIS — L4059 Other psoriatic arthropathy: Secondary | ICD-10-CM | POA: Diagnosis not present

## 2020-05-24 DIAGNOSIS — M7711 Lateral epicondylitis, right elbow: Secondary | ICD-10-CM | POA: Diagnosis not present

## 2020-06-05 DIAGNOSIS — J3089 Other allergic rhinitis: Secondary | ICD-10-CM | POA: Diagnosis not present

## 2020-06-05 DIAGNOSIS — J3081 Allergic rhinitis due to animal (cat) (dog) hair and dander: Secondary | ICD-10-CM | POA: Diagnosis not present

## 2020-06-05 DIAGNOSIS — J301 Allergic rhinitis due to pollen: Secondary | ICD-10-CM | POA: Diagnosis not present

## 2020-06-05 DIAGNOSIS — J4520 Mild intermittent asthma, uncomplicated: Secondary | ICD-10-CM | POA: Diagnosis not present

## 2020-09-25 DIAGNOSIS — E559 Vitamin D deficiency, unspecified: Secondary | ICD-10-CM | POA: Diagnosis not present

## 2020-09-25 DIAGNOSIS — E7800 Pure hypercholesterolemia, unspecified: Secondary | ICD-10-CM | POA: Diagnosis not present

## 2020-09-25 DIAGNOSIS — Z0000 Encounter for general adult medical examination without abnormal findings: Secondary | ICD-10-CM | POA: Diagnosis not present

## 2020-09-27 DIAGNOSIS — Z0000 Encounter for general adult medical examination without abnormal findings: Secondary | ICD-10-CM | POA: Diagnosis not present

## 2020-10-13 ENCOUNTER — Emergency Department (HOSPITAL_COMMUNITY)
Admission: EM | Admit: 2020-10-13 | Discharge: 2020-10-13 | Disposition: A | Discharge status: Home / Self Care | Payer: BC Managed Care – PPO | Attending: Emergency Medicine | Admitting: Emergency Medicine

## 2020-10-13 ENCOUNTER — Other Ambulatory Visit: Payer: Self-pay

## 2020-10-13 ENCOUNTER — Encounter (HOSPITAL_COMMUNITY): Payer: Self-pay | Admitting: *Deleted

## 2020-10-13 DIAGNOSIS — Z87891 Personal history of nicotine dependence: Secondary | ICD-10-CM | POA: Insufficient documentation

## 2020-10-13 DIAGNOSIS — J45909 Unspecified asthma, uncomplicated: Secondary | ICD-10-CM | POA: Insufficient documentation

## 2020-10-13 DIAGNOSIS — R109 Unspecified abdominal pain: Secondary | ICD-10-CM | POA: Insufficient documentation

## 2020-10-13 DIAGNOSIS — M1990 Unspecified osteoarthritis, unspecified site: Secondary | ICD-10-CM | POA: Insufficient documentation

## 2020-10-13 HISTORY — DX: Unspecified osteoarthritis, unspecified site: M19.90

## 2020-10-13 MED ORDER — KETOROLAC TROMETHAMINE 60 MG/2ML IM SOLN
15.0000 mg | Freq: Once | INTRAMUSCULAR | Status: AC
  Administered 2020-10-13: 15 mg via INTRAMUSCULAR
  Filled 2020-10-13: qty 2

## 2020-10-13 MED ORDER — DIAZEPAM 5 MG PO TABS
5.0000 mg | ORAL_TABLET | Freq: Once | ORAL | Status: AC
  Administered 2020-10-13: 5 mg via ORAL
  Filled 2020-10-13: qty 1

## 2020-10-13 MED ORDER — OXYCODONE HCL 5 MG PO TABS
5.0000 mg | ORAL_TABLET | Freq: Once | ORAL | Status: AC
  Administered 2020-10-13: 5 mg via ORAL
  Filled 2020-10-13: qty 1

## 2020-10-13 MED ORDER — ACETAMINOPHEN 500 MG PO TABS
1000.0000 mg | ORAL_TABLET | Freq: Once | ORAL | Status: AC
  Administered 2020-10-13: 1000 mg via ORAL
  Filled 2020-10-13: qty 2

## 2020-10-13 NOTE — Discharge Instructions (Signed)

## 2020-10-13 NOTE — ED Provider Notes (Signed)
[facility] EMERGENCY DEPARTMENT Provider Note   CSN: [id number] Arrival date & time: 10/13/20  4098     History Chief Complaint  Patient presents with  . Flank Pain    Left side pain    Allison Knox is a 50 y.o. female.  50 yo F with left-sided flank pain.  Going on for a few days.  Started is mild worse with movement palpation and twisting.  Worsened over the past 24 to 48 hours or so.  Feels okay when she sits still but whenever she tries to move hurts much more.  Denies injury to the area.  Denies loss of bowel or bladder denies loss of peritoneal sensation denies numbness or weakness to her legs.  Denies history of low back pain.  Feels like it radiates a little bit into her leg.  No fevers no dysuria.  No abdominal pain.  Denies recent instrumentation to her back.  Denies history of cancer.  The history is provided by the patient.  Flank Pain This is a new problem. The current episode started more than 2 days ago. The problem occurs constantly. The problem has been gradually worsening. Pertinent negatives include no chest pain, no headaches and no shortness of breath. The symptoms are aggravated by bending and twisting. Nothing relieves the symptoms. She has tried nothing for the symptoms. The treatment provided no relief.       Past Medical History:  Diagnosis Date  . Abnormal vaginal bleeding   . Arthritis    psoriatic arthritis dx April 2021  . Asthma    seasonal  . Ovarian cyst     Patient Active Problem List   Diagnosis Date Noted  . Arthritis   . Diverticula of colon 04/27/2018  . Acute cholecystitis 04/26/2018  . Hyponatremia 04/26/2018  . Cervical dystonia 06/22/2016    Past Surgical History:  Procedure Laterality Date  . HAND SURGERY    . HERNIA REPAIR    . OVARIAN CYST REMOVAL       OB History    Gravida  2   Para  2   Term  2   Preterm      AB      Living  2     SAB      IAB      Ectopic      Multiple      Live Births               Family History  Problem Relation Age of Onset  . Heart failure Mother   . Dementia Mother   . Diabetes Mother   . Hypertension Mother   . Lung cancer Father   . Hypertension Brother   . Rheum arthritis Brother   . Neuromuscular disorder Neg Hx     Social History   Tobacco Use  . Smoking status: Former Research scientist (life sciences)  . Smokeless tobacco: Never Used  Vaping Use  . Vaping Use: Never used  Substance Use Topics  . Alcohol use: Yes    Comment: occ  . Drug use: No    Home Medications Prior to Admission medications   Medication Sig Start Date End Date Taking? Authorizing Provider  ascorbic acid (QC VITAMIN C) 1000 MG tablet Take 1,000 mg by mouth every morning. Includes Zinc    [provider]  Cholecalciferol (VITAMIN D3) 50 MCG (2000 UT) TABS Take 1 tablet by mouth every morning.    [provider]  desonide (DESOWEN) 0.05 % ointment  Apply 1 application topically 2 (two) times daily.  04/22/18   [provider]  levocetirizine (XYZAL) 5 MG tablet Take 5 mg by mouth every evening.    [provider]    Allergies    Penicillins and Codeine  Review of Systems   Review of Systems  Constitutional: Negative for chills and fever.  HENT: Negative for congestion and rhinorrhea.   Eyes: Negative for redness and visual disturbance.  Respiratory: Negative for shortness of breath and wheezing.   Cardiovascular: Negative for chest pain and palpitations.  Gastrointestinal: Negative for nausea and vomiting.  Genitourinary: Positive for flank pain. Negative for dysuria and urgency.  Musculoskeletal: Positive for arthralgias and back pain. Negative for myalgias.  Skin: Negative for pallor and wound.  Neurological: Negative for dizziness and headaches.    Physical Exam Updated Vital Signs BP 126/84 (BP Location: Left Arm)   Pulse 70   Temp 97.8 F (36.6 C) (Oral)   Resp (!) 22   Ht 5\' 6"  (1.676 m)   Wt 81.6 kg   SpO2 100%   BMI 29.05 kg/m    Physical Exam Vitals and nursing note reviewed.  Constitutional:      General: She is not in acute distress.    Appearance: She is well-developed. She is not diaphoretic.  HENT:     Head: Normocephalic and atraumatic.  Eyes:     Pupils: Pupils are equal, round, and reactive to light.  Cardiovascular:     Rate and Rhythm: Normal rate and regular rhythm.     Heart sounds: No murmur heard. No friction rub. No gallop.   Pulmonary:     Effort: Pulmonary effort is normal.     Breath sounds: No wheezing or rales.  Abdominal:     General: There is no distension.     Palpations: Abdomen is soft.     Tenderness: There is no abdominal tenderness.  Musculoskeletal:        General: Tenderness present.     Cervical back: Normal range of motion and neck supple.     Comments: Mild tenderness palpation to the left SI joint area.  Pulse motor and sensation intact distally bilaterally.  Reflexes are 2+ and equal bilaterally.  Positive straight leg raise test on the left.  Skin:    General: Skin is warm and dry.  Neurological:     Mental Status: She is alert and oriented to person, place, and time.  Psychiatric:        Behavior: Behavior normal.     ED Results / Procedures / Treatments   Labs (all labs ordered are listed, but only abnormal results are displayed) Labs Reviewed - No data to display  EKG None  Radiology No results found.  Procedures Procedures   Medications Ordered in ED Medications  acetaminophen (TYLENOL) tablet 1,000 mg (1,000 mg Oral Given 10/13/20 0405)  ketorolac (TORADOL) injection 15 mg (15 mg Intramuscular Given 10/13/20 0407)  oxyCODONE (Oxy IR/ROXICODONE) immediate release tablet 5 mg (5 mg Oral Given 10/13/20 0406)  diazepam (VALIUM) tablet 5 mg (5 mg Oral Given 10/13/20 0406)    ED Course  I have reviewed the triage vital signs and the nursing notes.  Pertinent labs & imaging results that were available during my care of the patient were reviewed by me  and considered in my medical decision making (see chart for details).    MDM Rules/Calculators/A&P  51 yo F with a chief complaints of left-sided flank pain.  Most likely sciatica based on history, and exam.  No red flags.  We will treat as such.  PCP follow-up.  4:24 AM:  I have discussed the diagnosis/risks/treatment options with the patient and believe the pt to be eligible for discharge home to follow-up with PCP. We also discussed returning to the ED immediately if new or worsening sx occur. We discussed the sx which are most concerning (e.g., sudden worsening pain, fever, inability to tolerate by mouth) that necessitate immediate return. Medications administered to the patient during their visit and any new prescriptions provided to the patient are listed below.  Medications given during this visit Medications  acetaminophen (TYLENOL) tablet 1,000 mg (1,000 mg Oral Given 10/13/20 0405)  ketorolac (TORADOL) injection 15 mg (15 mg Intramuscular Given 10/13/20 0407)  oxyCODONE (Oxy IR/ROXICODONE) immediate release tablet 5 mg (5 mg Oral Given 10/13/20 0406)  diazepam (VALIUM) tablet 5 mg (5 mg Oral Given 10/13/20 0406)     The patient appears reasonably screen and/or stabilized for discharge and I doubt any other medical condition or other [facility] requiring further screening, evaluation, or treatment in the ED at this time prior to discharge.   Final Clinical Impression(s) / ED Diagnoses Final diagnoses:  Left flank pain    Rx / DC Orders ED Discharge Orders    None       Deno Etienne, DO 10/13/20 0424

## 2020-10-13 NOTE — ED Triage Notes (Addendum)
Pt c/o mild left side pain starting 5/11 that has become progressively more severe, affecting her movement.

## 2020-10-18 DIAGNOSIS — R109 Unspecified abdominal pain: Secondary | ICD-10-CM | POA: Diagnosis not present

## 2020-12-27 DIAGNOSIS — Z6828 Body mass index (BMI) 28.0-28.9, adult: Secondary | ICD-10-CM | POA: Diagnosis not present

## 2020-12-27 DIAGNOSIS — Z1231 Encounter for screening mammogram for malignant neoplasm of breast: Secondary | ICD-10-CM | POA: Diagnosis not present

## 2020-12-27 DIAGNOSIS — Z01419 Encounter for gynecological examination (general) (routine) without abnormal findings: Secondary | ICD-10-CM | POA: Diagnosis not present

## 2020-12-27 DIAGNOSIS — R350 Frequency of micturition: Secondary | ICD-10-CM | POA: Diagnosis not present

## 2021-03-05 DIAGNOSIS — Z01818 Encounter for other preprocedural examination: Secondary | ICD-10-CM | POA: Diagnosis not present

## 2021-04-08 DIAGNOSIS — Z1211 Encounter for screening for malignant neoplasm of colon: Secondary | ICD-10-CM | POA: Diagnosis not present

## 2021-06-05 DIAGNOSIS — J4520 Mild intermittent asthma, uncomplicated: Secondary | ICD-10-CM | POA: Diagnosis not present

## 2021-06-05 DIAGNOSIS — J301 Allergic rhinitis due to pollen: Secondary | ICD-10-CM | POA: Diagnosis not present

## 2021-06-05 DIAGNOSIS — J3089 Other allergic rhinitis: Secondary | ICD-10-CM | POA: Diagnosis not present

## 2021-06-05 DIAGNOSIS — J3081 Allergic rhinitis due to animal (cat) (dog) hair and dander: Secondary | ICD-10-CM | POA: Diagnosis not present

## 2021-07-16 DIAGNOSIS — R5382 Chronic fatigue, unspecified: Secondary | ICD-10-CM | POA: Diagnosis not present

## 2021-07-16 DIAGNOSIS — L4059 Other psoriatic arthropathy: Secondary | ICD-10-CM | POA: Diagnosis not present

## 2021-07-16 DIAGNOSIS — L401 Generalized pustular psoriasis: Secondary | ICD-10-CM | POA: Diagnosis not present

## 2021-07-16 DIAGNOSIS — M2550 Pain in unspecified joint: Secondary | ICD-10-CM | POA: Diagnosis not present

## 2021-07-16 DIAGNOSIS — M7711 Lateral epicondylitis, right elbow: Secondary | ICD-10-CM | POA: Diagnosis not present

## 2021-07-26 DIAGNOSIS — F4323 Adjustment disorder with mixed anxiety and depressed mood: Secondary | ICD-10-CM | POA: Diagnosis not present

## 2021-08-09 DIAGNOSIS — F4323 Adjustment disorder with mixed anxiety and depressed mood: Secondary | ICD-10-CM | POA: Diagnosis not present

## 2021-08-22 DIAGNOSIS — F4323 Adjustment disorder with mixed anxiety and depressed mood: Secondary | ICD-10-CM | POA: Diagnosis not present

## 2021-09-05 DIAGNOSIS — F4323 Adjustment disorder with mixed anxiety and depressed mood: Secondary | ICD-10-CM | POA: Diagnosis not present

## 2021-09-20 DIAGNOSIS — F4323 Adjustment disorder with mixed anxiety and depressed mood: Secondary | ICD-10-CM | POA: Diagnosis not present

## 2021-10-02 DIAGNOSIS — F4323 Adjustment disorder with mixed anxiety and depressed mood: Secondary | ICD-10-CM | POA: Diagnosis not present

## 2021-10-16 DIAGNOSIS — Z131 Encounter for screening for diabetes mellitus: Secondary | ICD-10-CM | POA: Diagnosis not present

## 2021-10-16 DIAGNOSIS — E559 Vitamin D deficiency, unspecified: Secondary | ICD-10-CM | POA: Diagnosis not present

## 2021-10-16 DIAGNOSIS — M2550 Pain in unspecified joint: Secondary | ICD-10-CM | POA: Diagnosis not present

## 2021-10-16 DIAGNOSIS — Z0000 Encounter for general adult medical examination without abnormal findings: Secondary | ICD-10-CM | POA: Diagnosis not present

## 2021-10-16 DIAGNOSIS — L401 Generalized pustular psoriasis: Secondary | ICD-10-CM | POA: Diagnosis not present

## 2021-10-16 DIAGNOSIS — M7711 Lateral epicondylitis, right elbow: Secondary | ICD-10-CM | POA: Diagnosis not present

## 2021-10-16 DIAGNOSIS — Z79899 Other long term (current) drug therapy: Secondary | ICD-10-CM | POA: Diagnosis not present

## 2021-10-16 DIAGNOSIS — E7800 Pure hypercholesterolemia, unspecified: Secondary | ICD-10-CM | POA: Diagnosis not present

## 2021-10-16 DIAGNOSIS — L4059 Other psoriatic arthropathy: Secondary | ICD-10-CM | POA: Diagnosis not present

## 2021-10-18 DIAGNOSIS — F4323 Adjustment disorder with mixed anxiety and depressed mood: Secondary | ICD-10-CM | POA: Diagnosis not present

## 2021-11-15 DIAGNOSIS — F4323 Adjustment disorder with mixed anxiety and depressed mood: Secondary | ICD-10-CM | POA: Diagnosis not present

## 2021-12-12 DIAGNOSIS — F4323 Adjustment disorder with mixed anxiety and depressed mood: Secondary | ICD-10-CM | POA: Diagnosis not present

## 2021-12-27 DIAGNOSIS — F4323 Adjustment disorder with mixed anxiety and depressed mood: Secondary | ICD-10-CM | POA: Diagnosis not present

## 2022-01-07 DIAGNOSIS — Z1231 Encounter for screening mammogram for malignant neoplasm of breast: Secondary | ICD-10-CM | POA: Diagnosis not present

## 2022-01-07 DIAGNOSIS — Z01419 Encounter for gynecological examination (general) (routine) without abnormal findings: Secondary | ICD-10-CM | POA: Diagnosis not present

## 2022-01-07 DIAGNOSIS — Z6827 Body mass index (BMI) 27.0-27.9, adult: Secondary | ICD-10-CM | POA: Diagnosis not present

## 2022-01-09 DIAGNOSIS — L400 Psoriasis vulgaris: Secondary | ICD-10-CM | POA: Diagnosis not present

## 2022-01-09 DIAGNOSIS — L4050 Arthropathic psoriasis, unspecified: Secondary | ICD-10-CM | POA: Diagnosis not present

## 2022-01-10 DIAGNOSIS — F4323 Adjustment disorder with mixed anxiety and depressed mood: Secondary | ICD-10-CM | POA: Diagnosis not present

## 2022-01-22 DIAGNOSIS — M7711 Lateral epicondylitis, right elbow: Secondary | ICD-10-CM | POA: Diagnosis not present

## 2022-01-22 DIAGNOSIS — L401 Generalized pustular psoriasis: Secondary | ICD-10-CM | POA: Diagnosis not present

## 2022-01-22 DIAGNOSIS — L4059 Other psoriatic arthropathy: Secondary | ICD-10-CM | POA: Diagnosis not present

## 2022-01-22 DIAGNOSIS — M2550 Pain in unspecified joint: Secondary | ICD-10-CM | POA: Diagnosis not present

## 2022-01-24 DIAGNOSIS — F4323 Adjustment disorder with mixed anxiety and depressed mood: Secondary | ICD-10-CM | POA: Diagnosis not present

## 2022-01-29 DIAGNOSIS — N950 Postmenopausal bleeding: Secondary | ICD-10-CM | POA: Diagnosis not present

## 2022-01-29 DIAGNOSIS — Z6827 Body mass index (BMI) 27.0-27.9, adult: Secondary | ICD-10-CM | POA: Diagnosis not present

## 2022-02-11 DIAGNOSIS — L403 Pustulosis palmaris et plantaris: Secondary | ICD-10-CM | POA: Diagnosis not present

## 2022-02-11 DIAGNOSIS — L400 Psoriasis vulgaris: Secondary | ICD-10-CM | POA: Diagnosis not present

## 2022-03-06 DIAGNOSIS — F4323 Adjustment disorder with mixed anxiety and depressed mood: Secondary | ICD-10-CM | POA: Diagnosis not present

## 2022-04-02 DIAGNOSIS — R61 Generalized hyperhidrosis: Secondary | ICD-10-CM | POA: Diagnosis not present

## 2022-04-02 DIAGNOSIS — K921 Melena: Secondary | ICD-10-CM | POA: Diagnosis not present

## 2022-04-02 DIAGNOSIS — R0683 Snoring: Secondary | ICD-10-CM | POA: Diagnosis not present

## 2022-04-14 DIAGNOSIS — H6690 Otitis media, unspecified, unspecified ear: Secondary | ICD-10-CM | POA: Diagnosis not present

## 2022-04-23 DIAGNOSIS — L0882 Omphalitis not of newborn: Secondary | ICD-10-CM | POA: Diagnosis not present

## 2022-04-23 DIAGNOSIS — H6691 Otitis media, unspecified, right ear: Secondary | ICD-10-CM | POA: Diagnosis not present

## 2022-05-29 DIAGNOSIS — H65192 Other acute nonsuppurative otitis media, left ear: Secondary | ICD-10-CM | POA: Diagnosis not present

## 2022-06-03 DIAGNOSIS — F4323 Adjustment disorder with mixed anxiety and depressed mood: Secondary | ICD-10-CM | POA: Diagnosis not present

## 2022-06-09 DIAGNOSIS — L4050 Arthropathic psoriasis, unspecified: Secondary | ICD-10-CM | POA: Diagnosis not present

## 2022-06-09 DIAGNOSIS — L409 Psoriasis, unspecified: Secondary | ICD-10-CM | POA: Diagnosis not present

## 2022-06-09 DIAGNOSIS — I10 Essential (primary) hypertension: Secondary | ICD-10-CM | POA: Diagnosis not present

## 2022-06-13 ENCOUNTER — Ambulatory Visit (INDEPENDENT_AMBULATORY_CARE_PROVIDER_SITE_OTHER): Payer: BC Managed Care – PPO | Admitting: Podiatry

## 2022-06-13 ENCOUNTER — Ambulatory Visit (INDEPENDENT_AMBULATORY_CARE_PROVIDER_SITE_OTHER): Payer: BC Managed Care – PPO

## 2022-06-13 DIAGNOSIS — M7742 Metatarsalgia, left foot: Secondary | ICD-10-CM | POA: Diagnosis not present

## 2022-06-13 DIAGNOSIS — M2062 Acquired deformities of toe(s), unspecified, left foot: Secondary | ICD-10-CM | POA: Diagnosis not present

## 2022-06-13 DIAGNOSIS — R200 Anesthesia of skin: Secondary | ICD-10-CM

## 2022-06-13 NOTE — Progress Notes (Signed)
Subjective:   Patient ID: Allison Knox, female   DOB: 52 y.o.   MRN: [id number]   HPI Chief Complaint  Patient presents with   Foot Problem    Numbness in the toes due to arthritis , 2nd digit on left/right  feet are shifting, left 2nd digit is numb    52 year old female presents the office with above concerns.  She is any pain, numbness of the second toe.  She feels that the toes are shifting.  She has more discomfort of the digit.  No injuries that she reports.  No recent treatment.  Started about 2 weeks ago.   The toes have shifted some over the year.   She has psortiiac arthritis   Review of Systems  All other systems reviewed and are negative.  Past Medical History:  Diagnosis Date   Abnormal vaginal bleeding    Arthritis    psoriatic arthritis dx April 2021   Asthma    seasonal   Ovarian cyst     Past Surgical History:  Procedure Laterality Date   HAND SURGERY     HERNIA REPAIR     OVARIAN CYST REMOVAL       Current Outpatient Medications:    ascorbic acid (QC VITAMIN C) 1000 MG tablet, Take 1,000 mg by mouth every morning. Includes Zinc (Patient not taking: Reported on 06/13/2022), Disp: , Rfl:    Cholecalciferol (VITAMIN D3) 50 MCG (2000 UT) TABS, Take 1 tablet by mouth every morning. (Patient not taking: Reported on 06/13/2022), Disp: , Rfl:    desonide (DESOWEN) 0.05 % ointment, Apply 1 application topically 2 (two) times daily.  (Patient not taking: Reported on 06/13/2022), Disp: , Rfl:    levocetirizine (XYZAL) 5 MG tablet, Take 5 mg by mouth every evening. (Patient not taking: Reported on 06/13/2022), Disp: , Rfl:  No current facility-administered medications for this visit.  Facility-Administered Medications Ordered in Other Visits:    gadopentetate dimeglumine (MAGNEVIST) injection 17 mL, 17 mL, Intravenous, Once PRN, Melvenia Beam, MD  Allergies  Allergen Reactions   Penicillins Rash    Has patient had a PCN reaction causing immediate rash,  facial/tongue/throat swelling, SOB or lightheadedness with hypotension: Yes Has patient had a PCN reaction causing severe rash involving mucus membranes or skin necrosis: No Has patient had a PCN reaction that required hospitalization: No Has patient had a PCN reaction occurring within the last 10 years: No If all of the above answers are "NO", then may proceed with Cephalosporin use.    Codeine     Makes her "hyper"   Humira (2 Pen) [Adalimumab] Other (See Comments)    Made psoriasis worse           Objective:  Physical Exam  General: AAO x3, NAD  Dermatological: Skin is warm, dry and supple bilateral. There are no open sores, no preulcerative lesions, no rash or signs of infection present.  Vascular: Dorsalis Pedis artery and Posterior Tibial artery pedal pulses are 2/4 bilateral with immedate capillary fill time.  There is no pain with calf compression, swelling, warmth, erythema.   Neruologic: Grossly intact via light touch bilateral.   Musculoskeletal: There is swelling of the second and third toes upon weightbearing.  There is no palpable neuroma.  There was some localized edema along the second MPJ plantarly.  No significant tenderness noted otherwise.  Muscular strength 5/5 in all groups tested bilateral.  Gait: Unassisted, Nonantalgic.       Assessment:   Capsulitis, splaying  digits     Plan:  -Treatment options discussed including all alternatives, risks, and complications -Etiology of symptoms were discussed -X-rays were obtained and reviewed with the patient.  Elongated second ray.  No subacute fracture.  Splaying of the second third toes. -Discussed both conservative as well as surgical treatment options.  Continue with conservative care for now.  I dispensed a splint to help hold the second third toes together.  Dispensed offloading pads to the MPJs as well.  Dispensed gel as well as a felt metatarsal pad.  Discussed shoe modifications as well.  If symptoms  persist consider surgical intervention.  Trula Slade DPM

## 2022-06-20 DIAGNOSIS — F4323 Adjustment disorder with mixed anxiety and depressed mood: Secondary | ICD-10-CM | POA: Diagnosis not present

## 2022-07-04 DIAGNOSIS — F4323 Adjustment disorder with mixed anxiety and depressed mood: Secondary | ICD-10-CM | POA: Diagnosis not present

## 2022-07-18 DIAGNOSIS — F4323 Adjustment disorder with mixed anxiety and depressed mood: Secondary | ICD-10-CM | POA: Diagnosis not present

## 2022-08-11 ENCOUNTER — Telehealth: Payer: Self-pay | Admitting: Podiatry

## 2022-08-11 ENCOUNTER — Other Ambulatory Visit: Payer: Self-pay | Admitting: Podiatry

## 2022-08-11 MED ORDER — MELOXICAM 15 MG PO TABS: 15.0000 mg | ORAL_TABLET | Freq: Every day | ORAL | 0 refills | Status: DC | PRN

## 2022-08-11 NOTE — Telephone Encounter (Signed)
Pt called & stated that on her last visit on 1/12; Dr. Jacqualyn Posey offered her a Rx for inflammation & swelling but pt declined. She stated the her 2nd toe on the Left foot has not gotten better after doing home remedies & she would like to now start taking the Rx. Please advise.

## 2022-10-20 ENCOUNTER — Ambulatory Visit: Payer: BC Managed Care – PPO | Admitting: Podiatry

## 2022-10-21 DIAGNOSIS — E559 Vitamin D deficiency, unspecified: Secondary | ICD-10-CM | POA: Diagnosis not present

## 2022-10-21 DIAGNOSIS — E7800 Pure hypercholesterolemia, unspecified: Secondary | ICD-10-CM | POA: Diagnosis not present

## 2022-10-21 DIAGNOSIS — Z0000 Encounter for general adult medical examination without abnormal findings: Secondary | ICD-10-CM | POA: Diagnosis not present

## 2022-10-30 ENCOUNTER — Ambulatory Visit (INDEPENDENT_AMBULATORY_CARE_PROVIDER_SITE_OTHER): Payer: BC Managed Care – PPO

## 2022-10-30 ENCOUNTER — Ambulatory Visit (INDEPENDENT_AMBULATORY_CARE_PROVIDER_SITE_OTHER): Payer: BC Managed Care – PPO | Admitting: Podiatry

## 2022-10-30 ENCOUNTER — Other Ambulatory Visit: Payer: Self-pay | Admitting: Podiatry

## 2022-10-30 DIAGNOSIS — M778 Other enthesopathies, not elsewhere classified: Secondary | ICD-10-CM

## 2022-10-30 DIAGNOSIS — R200 Anesthesia of skin: Secondary | ICD-10-CM

## 2022-10-30 DIAGNOSIS — G5762 Lesion of plantar nerve, left lower limb: Secondary | ICD-10-CM

## 2022-10-30 NOTE — Progress Notes (Signed)
Subjective: Chief Complaint  Patient presents with   Numbness    Numbness in toe    53 year old female presents the office for above concerns.  She states that she still has numbness in the second toe mostly based less puffy than what it was previously.  She is been doing turmeric as well as cherry juice.  No recent injury or changes.  Objective: AAO x3, NAD- husband present  DP/PT pulses palpable bilaterally, CRT less than 3 seconds There is still edema present along the second interspace, submetatarsal 2 on the left foot.  There is a small palpable neuroma versus bursa today.  There is no area of pinpoint tenderness.  There is no erythema or warmth.  No MPJ instability. No pain with calf compression, swelling, warmth, erythema  Assessment: 52 year old female with concern for neuroma/bursitis  Plan: -All treatment options discussed with the patient including all alternatives, risks, complications.  -Repeat x-rays were obtained reviewed.  Elongation of the second metatarsal.  Deviation of the second and third toes noted.  No evidence of acute fracture. -We discussed different treatment options.  Given the ongoing symptoms which been ongoing for greater than 6 months, will order an MRI for further evaluation of neuroma, bursa given ongoing swelling and discomfort as well as numbness to the toe.  Continue offloading pads which has been somewhat helpful.  Discussed anti-inflammatories.  We offered steroid injection but will hold off until the MRI. -Patient encouraged to call the office with any questions, concerns, change in symptoms.   Vivi Barrack DPM

## 2022-10-30 NOTE — Patient Instructions (Signed)
I have ordered a MRI of the foot. If you do not hear for them about scheduling within the next 1 week, or you have any questions please give [place] a call at [phone].

## 2022-11-25 ENCOUNTER — Ambulatory Visit
Admission: RE | Admit: 2022-11-25 | Discharge: 2022-11-25 | Disposition: A | Discharge status: Home / Self Care | Payer: BC Managed Care – PPO | Source: Ambulatory Visit | Attending: Podiatry | Admitting: Podiatry

## 2022-11-25 DIAGNOSIS — M799 Soft tissue disorder, unspecified: Secondary | ICD-10-CM | POA: Diagnosis not present

## 2022-11-25 DIAGNOSIS — L408 Other psoriasis: Secondary | ICD-10-CM | POA: Diagnosis not present

## 2022-11-25 DIAGNOSIS — M778 Other enthesopathies, not elsewhere classified: Secondary | ICD-10-CM

## 2022-11-25 DIAGNOSIS — M79672 Pain in left foot: Secondary | ICD-10-CM | POA: Diagnosis not present

## 2022-11-25 DIAGNOSIS — G5762 Lesion of plantar nerve, left lower limb: Secondary | ICD-10-CM

## 2022-11-25 DIAGNOSIS — L400 Psoriasis vulgaris: Secondary | ICD-10-CM | POA: Diagnosis not present

## 2023-01-12 ENCOUNTER — Ambulatory Visit: Payer: BC Managed Care – PPO | Admitting: Podiatry

## 2023-01-12 DIAGNOSIS — M7752 Other enthesopathy of left foot: Secondary | ICD-10-CM

## 2023-01-12 NOTE — Progress Notes (Signed)
Chief Complaint  Patient presents with   Foot Pain    LEFT FOOT PAIN, SWELLING HAS WENT DOWN, STILL HAVING NUMBNESS, SHE WOULD LIKE AN INJECTION IF YOU FEEL LIKE IT IS NEEDED, STATS 2ND AND 3RD TOES ARE SEPARATING.    52 year old female presents the office for above concerns.  States that she originally thought she had an injection but she has been really trying to take care of her feet her symptoms are improved and she is not sure if she needs an allergy not any pain.  No recent injury or changes otherwise.  Objective: AAO x3, NAD DP/PT pulses palpable bilaterally, CRT less than 3 seconds There is still some mild edema present along the second interspace, submetatarsal 2 on the left foot, much improved.  Not able to elicit any area of tenderness today.  No area pinpoint tenderness.   No pain with calf compression, swelling, warmth, erythema  Assessment: 52 year old female with concern for neuroma/bursitis  Plan: -All treatment options discussed with the patient including all alternatives, risks, complications.  -Overall her symptoms are improved.  Discussed steroid injection previously but since her symptoms have improved we will hold on this.  However if symptoms recur we will likely proceed with a steroid injection.  Continue shoes, good arch support.  Vivi Barrack DPM

## 2023-01-14 DIAGNOSIS — Z6824 Body mass index (BMI) 24.0-24.9, adult: Secondary | ICD-10-CM | POA: Diagnosis not present

## 2023-01-14 DIAGNOSIS — Z01419 Encounter for gynecological examination (general) (routine) without abnormal findings: Secondary | ICD-10-CM | POA: Diagnosis not present

## 2023-01-30 DIAGNOSIS — F4322 Adjustment disorder with anxiety: Secondary | ICD-10-CM | POA: Diagnosis not present

## 2023-03-13 DIAGNOSIS — F4322 Adjustment disorder with anxiety: Secondary | ICD-10-CM | POA: Diagnosis not present

## 2023-04-02 DIAGNOSIS — N898 Other specified noninflammatory disorders of vagina: Secondary | ICD-10-CM | POA: Diagnosis not present

## 2023-04-02 DIAGNOSIS — N905 Atrophy of vulva: Secondary | ICD-10-CM | POA: Diagnosis not present

## 2023-04-03 DIAGNOSIS — F4322 Adjustment disorder with anxiety: Secondary | ICD-10-CM | POA: Diagnosis not present

## 2023-04-21 DIAGNOSIS — M25461 Effusion, right knee: Secondary | ICD-10-CM | POA: Diagnosis not present

## 2023-04-21 DIAGNOSIS — L4050 Arthropathic psoriasis, unspecified: Secondary | ICD-10-CM | POA: Diagnosis not present

## 2023-04-23 DIAGNOSIS — F4322 Adjustment disorder with anxiety: Secondary | ICD-10-CM | POA: Diagnosis not present

## 2023-05-07 DIAGNOSIS — B078 Other viral warts: Secondary | ICD-10-CM | POA: Diagnosis not present

## 2023-05-07 DIAGNOSIS — L408 Other psoriasis: Secondary | ICD-10-CM | POA: Diagnosis not present

## 2023-05-07 DIAGNOSIS — L400 Psoriasis vulgaris: Secondary | ICD-10-CM | POA: Diagnosis not present

## 2023-05-07 DIAGNOSIS — K130 Diseases of lips: Secondary | ICD-10-CM | POA: Diagnosis not present

## 2023-05-07 DIAGNOSIS — D225 Melanocytic nevi of trunk: Secondary | ICD-10-CM | POA: Diagnosis not present

## 2023-05-07 DIAGNOSIS — L570 Actinic keratosis: Secondary | ICD-10-CM | POA: Diagnosis not present

## 2023-05-07 DIAGNOSIS — D485 Neoplasm of uncertain behavior of skin: Secondary | ICD-10-CM | POA: Diagnosis not present

## 2023-05-07 DIAGNOSIS — L814 Other melanin hyperpigmentation: Secondary | ICD-10-CM | POA: Diagnosis not present

## 2023-05-14 DIAGNOSIS — F4322 Adjustment disorder with anxiety: Secondary | ICD-10-CM | POA: Diagnosis not present

## 2023-06-10 DIAGNOSIS — E0500 Thyrotoxicosis with diffuse goiter without thyrotoxic crisis or storm: Secondary | ICD-10-CM | POA: Insufficient documentation

## 2023-06-10 DIAGNOSIS — E785 Hyperlipidemia, unspecified: Secondary | ICD-10-CM | POA: Insufficient documentation

## 2023-06-10 NOTE — Progress Notes (Signed)
Office Visit Note  Patient: Allison Knox             Date of Birth: 08-18-1970           MRN: [id number]             PCP: Mila Palmer, MD Referring: Shon Hale, * Visit Date: 06/24/2023 Occupation: @GUAROCC @  Subjective:  Pain in joints and psoriasis  History of Present Illness: Allison Knox is a 53 y.o. female seen in consultation per request of Dr. Chanetta Marshall.  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  She will have episodes every 10 years when she will flareup in the bottom of her feet and they responded to application of tar.  In 2022 she developed right knee joint effusion for which she was seen at [facility].  She recalls having aspirations x 3 and also difficulty walking and using crutches.  At the time she was referred to [facility] rheumatology where she was seen by Azucena Fallen.  She took NSAIDs for some time and then she was started on Humira in March 2023 she states after taking Humira for 1 month she noticed some plaque psoriasis on her extremities which gradually spread all over.  Humira was discontinued after 6 months.  She decided to go to a naturopath where she had detox therapy in January 2024.  She states gradually she started feeling better and the psoriasis improved.  She had residual psoriasis on her right knee and in the pubic region.  She had been seeing Elpidio Anis, PA-C at [facility] dermatology.  She states she has been given topical clobetasol and Vtama.  She continues to have some discomfort in her neck, upper back, right knee and left wrist.  She is left-handed.  She has had problems with left foot swelling last year.  She denies any history of plantar fasciitis, Achilles tendinitis, uveitis.  She has had problems with lateral epicondylitis and trochanteric bursitis in the past.  She is gravida 2, para 2.  There is no history of preeclampsia or DVTs.  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  She is very  active and this activities with her 3 grandchildren.  This history of psoriasis in her sister and rheumatoid arthritis in her brother.    Activities of Daily Living:  Patient reports morning stiffness for a few minutes intermittently.  Patient Denies nocturnal pain.  Difficulty dressing/grooming: Denies Difficulty climbing stairs: Denies Difficulty getting out of chair: Denies Difficulty using hands for taps, buttons, cutlery, and/or writing: Denies  Review of Systems  Constitutional:  Negative for fatigue.  HENT:  Negative for mouth sores, mouth dryness and nose dryness.   Eyes:  Negative for pain and dryness.  Respiratory:  Negative for shortness of breath and difficulty breathing.   Cardiovascular:  Negative for chest pain and palpitations.  Gastrointestinal:  Negative for blood in stool, constipation and diarrhea.  Endocrine: Negative for increased urination.  Genitourinary:  Negative for involuntary urination.  Musculoskeletal:  Positive for joint pain, joint pain and joint swelling. Negative for gait problem, myalgias, muscle weakness, morning stiffness, muscle tenderness and myalgias.  Skin:  Positive for rash. Negative for color change, hair loss and sensitivity to sunlight.  Allergic/Immunologic: Negative for susceptible to infections.  Neurological:  Negative for dizziness and headaches.  Hematological:  Negative for swollen glands.  Psychiatric/Behavioral:  Positive for sleep disturbance. Negative for depressed mood. The patient is not  nervous/anxious.     PMFS History:  Patient Active Problem List   Diagnosis Date Noted   Psoriatic arthritis (HCC) 06/24/2023   Psoriasis 06/24/2023   Graves disease 06/10/2023   Dyslipidemia 06/10/2023   Arthritis    Diverticula of colon 04/27/2018   Acute cholecystitis 04/26/2018   Hyponatremia 04/26/2018   Cervical dystonia 06/22/2016    Past Medical History:  Diagnosis Date   Abnormal vaginal bleeding    Arthritis     psoriatic arthritis dx April 2021   Asthma    seasonal   Ovarian cyst     Family History  Problem Relation Age of Onset   Gallstones Mother    Heart failure Mother    Dementia Mother    Diabetes Mother    Hypertension Mother    Lung cancer Father    Anxiety disorder Sister    Psoriasis Sister    Hypertension Brother    Gallstones Brother    Rheum arthritis Brother    Healthy Son    Healthy Daughter    Neuromuscular disorder Neg Hx    Past Surgical History:  Procedure Laterality Date   HAND SURGERY Right    HERNIA REPAIR     as a child   OVARIAN CYST REMOVAL     Social History   Social History Narrative   Lives with husband   Caffeine use: Hot tea (black tea) daily   Coffee sometimes    There is no immunization history on file for this patient.   Objective: Vital Signs: BP 125/85 (BP Location: Left Arm, Patient Position: Sitting, Cuff Size: Normal)   Pulse 71   Resp 15   Ht 5' 5.5" (1.664 m)   Wt 164 lb (74.4 kg)   LMP 05/16/2018   BMI 26.88 kg/m    Physical Exam Vitals and nursing note reviewed.  Constitutional:      Appearance: She is well-developed.  HENT:     Head: Normocephalic and atraumatic.  Eyes:     Conjunctiva/sclera: Conjunctivae normal.  Cardiovascular:     Rate and Rhythm: Normal rate and regular rhythm.     Heart sounds: Normal heart sounds.  Pulmonary:     Effort: Pulmonary effort is normal.     Breath sounds: Normal breath sounds.  Abdominal:     General: Bowel sounds are normal.     Palpations: Abdomen is soft.  Musculoskeletal:     Cervical back: Normal range of motion.  Lymphadenopathy:     Cervical: No cervical adenopathy.  Skin:    General: Skin is warm and dry.     Capillary Refill: Capillary refill takes less than 2 seconds.  Neurological:     Mental Status: She is alert and oriented to person, place, and time.  Psychiatric:        Behavior: Behavior normal.      Musculoskeletal Exam: Cervical spine was in good  range of motion with some discomfort.She has some tenderness over the upper thoracic region.  There was good mobility in the lumbar spine.  There was mild tenderness over right SI joint.  Shoulders, elbows, wrist joints, MCPs PIPs and DIPs with good range of motion with no synovitis.  No dactylitis was noted.  No nail pitting was noted.  Hip joints were in good range of motion.  She had mild tenderness over right trochanter region.  Knee joints were in good range of motion.  She had warmth on palpation of right knee joint without any effusion.  There  was no tenderness over ankles or MTPs.  Bilateral pes cavus, hammertoes were noted.  There was no plantar fasciitis or Achilles tendinitis.  CDAI Exam: CDAI Score: -- Patient Global: --; Provider Global: -- Swollen: --; Tender: -- Joint Exam 06/24/2023   No joint exam has been documented for this visit   There is currently no information documented on the homunculus. Go to the Rheumatology activity and complete the homunculus joint exam.  Investigation: No additional findings.  Imaging: No results found.  Recent Labs: Lab Results  Component Value Date   WBC 7.6 04/27/2018   HGB 11.0 (L) 04/27/2018   PLT 300 04/27/2018   NA 138 04/27/2018   K 3.3 (L) 04/27/2018   CL 106 04/27/2018   CO2 24 04/27/2018   GLUCOSE 137 (H) 04/27/2018   BUN 10 04/27/2018   CREATININE 0.82 04/27/2018   BILITOT 0.5 04/27/2018   ALKPHOS 70 04/27/2018   AST 16 04/27/2018   ALT 15 04/27/2018   PROT 6.7 04/27/2018   ALBUMIN 3.7 04/27/2018   CALCIUM 8.4 (L) 04/27/2018   GFRAA >60 04/27/2018   November 2019 HIV negative Speciality Comments: No specialty comments available.  Procedures:  No procedures performed Allergies: Penicillins, Codeine, and Humira (2 pen) [adalimumab]   Assessment / Plan:     Visit Diagnoses: Psoriatic arthritis [facility]) - Diagnosed April 2021 by [facility] rheumatology, history of recurrent right knee effusion in the past.  She  continues to have pain and discomfort in multiple joints.  Warmth was noted on palpation of her right knee joint.  She has intermittent discomfort in her left wrist joint.  She denies any history of dactylitis.  She has had problems with inflammatory arthritis in her left wrist and left MTPs.  There is no history of Achilles tendinitis, plantar fasciitis.  She has had episodes of lateral epicondylitis, trochanteric bursitis, neck and thoracic pain.  She also gives history of left TMJ discomfort.  Patient was tried on Humira in the past at [facility] rheumatology but it was discontinued after 6 months due to a flare of pustular psoriasis.  Psoriasis improved with natural therapy and detoxification per patient.  I did detailed discussion with the patient regarding different treatment options including oral methotrexate and  [facility] [place] may not be very effective with the spinal discomfort.  I also discussed the option of Skyrizi.  After discussing indications side effects contraindications patient wants to proceed with the [facility].  Counseled patient that Cristy Folks is a IL-23 inhibitor.  Counseled patient on purpose, proper use, and adverse effects of Skyrizi.  Reviewed the most common adverse effects including infection, URTIs, headache, fatigue, tinea infections, and injection site. Counseled patient that Cristy Folks should be held for infection and prior to scheduled surgery.  Recommend annual influenza, PCV 15 or PCV20 or Pneumovax 23, and Shingrix as indicated. Advised that live vaccines should be avoided while taking Skyrizi.  Reviewed the importance of regular labs while on Skyrizi therapy.  Will monitor CBC and CMP 1 month after starting and then every 3 months routinely thereafter. Will monitor TB gold annually. Standing orders placed.  Provided patient with medication education material and answered all questions.  Patient voiced understanding.  Patient consented to [place].  Will upload consent into the media tab.   Reviewed storage instructions of Skyrizi.  Advised initial injection must be administered in office.  Patient verbalized understanding.  Will apply for Skyrizi through [employer] and update when we receive a response.  Dose will be Skyrizi 150 mg  subcuteanously at week 0, week 4 then every 12 weeks thereafter.  Prescription pending lab results and insurance approval.   Psoriasis-she has psoriasis patches on her right knee and also in the pubic region.  She has been going to  [facility].  She has been using topical agents.  High risk medication use -(Humira 40 mg subcu every other week March 2023 for 6 months but discontinued due to a flare of psoriasis)- Plan: CBC with Differential/Platelet, COMPLETE METABOLIC PANEL WITH GFR, Hepatitis B core antibody, IgM, Hepatitis B surface antigen, Hepatitis C antibody, QuantiFERON-TB Gold Plus, Serum protein electrophoresis with reflex, IgG, IgA, IgM.  Patient was advised to get labs in a month after starting Skyrizi and then every 3 months.  Information on immunization was placed in the AVS.  She was also advised to hold Skyrizi if she develops an infection and resume after the infection resolves.  Pain in both hands -she complains of stiffness in her bilateral hands and intermittent swelling in her left wrist joint.  No synovitis or dactylitis was noted on the examination today.  Plan: XR Hand 2 View Right, XR Hand 2 View Left, x-rays of bilateral hands were unremarkable except for early degenerative changes.  Sedimentation rate, Rheumatoid factor, Cyclic citrul peptide antibody, IgG, ANA  Lateral epicondylitis, right elbow-intermittent discomfort.  Trochanteric bursitis, right hip-she has ongoing discomfort over right trochanteric region.  Chronic pain of right knee -she had warmth and tenderness on palpation of right knee joint and incomplete flexion.  She had recurrent effusions in the past requiring aspiration by [facility].  Plan: XR KNEE 3  VIEW RIGHT.  X-rays of the knee joint were unremarkable.  Pain feet-she has a chronic discomfort in her feet.  She recalls having swelling in her left foot last year.  She denies any history of Achilles tendinitis or plantar fasciitis.  Pes cavus-she has bilateral pes cavus.  Hammertoes were also noted and dorsal spurring was noted.  Proper fitting shoes with arch support were advised.  DDD (degenerative disc disease), cervical -patient was evaluated by his spine specialist in the past.  According the patient the x-ray showed fluid collection.  Status post cortisone injections.  She continues to have some stiffness in the cervical region.  Pain in thoracic spine -she gives history of chronic discomfort in the thoracic region.  Plan: XR Thoracic Spine 2 View  Chronic right SI joint pain -she has intermittent pain in the right SI joint.  No tenderness was present today.  Plan: XR Pelvis 1-2 Views.  Right SI joint sclerosis was noted.  Other fatigue - Plan: CK, TSH  Dyslipidemia  Vitamin D deficiency - Plan: VITAMIN D 25 Hydroxy (Vit-D Deficiency, Fractures)  Graves disease  Diverticula of colon  Seasonal allergic rhinitis due to other allergic trigger  Family history of psoriatic arthritis-sister  Family history of rheumatoid arthritis-Brother  Orders: Orders Placed This Encounter  Procedures   XR Hand 2 View Right   XR Hand 2 View Left   XR KNEE 3 VIEW RIGHT   XR Pelvis 1-2 Views   XR Thoracic Spine 2 View   CBC with Differential/Platelet   COMPLETE METABOLIC PANEL WITH GFR   CK   TSH   VITAMIN D 25 Hydroxy (Vit-D Deficiency, Fractures)   Sedimentation rate   Rheumatoid factor   Cyclic citrul peptide antibody, IgG   Hepatitis B core antibody, IgM   Hepatitis B surface antigen   Hepatitis C antibody   QuantiFERON-TB Gold Plus  Serum protein electrophoresis with reflex   IgG, IgA, IgM   ANA   No orders of the defined types were placed in this  encounter.   Face-to-face time spent with patient was 60 minutes. Greater than 50% of time was spent in counseling and coordination of care.  Follow-Up Instructions: Return for Psoriatic arthritis.   Pollyann Savoy, MD  Note - This record has been created using Animal nutritionist.  Chart creation errors have been sought, but may not always  have been located. Such creation errors do not reflect on  the standard of medical care.

## 2023-06-24 ENCOUNTER — Ambulatory Visit (INDEPENDENT_AMBULATORY_CARE_PROVIDER_SITE_OTHER): Payer: BC Managed Care – PPO

## 2023-06-24 ENCOUNTER — Ambulatory Visit: Payer: BC Managed Care – PPO | Attending: Rheumatology | Admitting: Rheumatology

## 2023-06-24 ENCOUNTER — Ambulatory Visit: Payer: BC Managed Care – PPO

## 2023-06-24 ENCOUNTER — Encounter: Payer: Self-pay | Admitting: Rheumatology

## 2023-06-24 VITALS — BP 125/85 | HR 71 | Resp 15 | Ht 65.5 in | Wt 164.0 lb

## 2023-06-24 DIAGNOSIS — M533 Sacrococcygeal disorders, not elsewhere classified: Secondary | ICD-10-CM | POA: Diagnosis not present

## 2023-06-24 DIAGNOSIS — G8929 Other chronic pain: Secondary | ICD-10-CM

## 2023-06-24 DIAGNOSIS — M25561 Pain in right knee: Secondary | ICD-10-CM

## 2023-06-24 DIAGNOSIS — M7061 Trochanteric bursitis, right hip: Secondary | ICD-10-CM

## 2023-06-24 DIAGNOSIS — L4050 Arthropathic psoriasis, unspecified: Secondary | ICD-10-CM | POA: Diagnosis not present

## 2023-06-24 DIAGNOSIS — Z79899 Other long term (current) drug therapy: Secondary | ICD-10-CM

## 2023-06-24 DIAGNOSIS — E559 Vitamin D deficiency, unspecified: Secondary | ICD-10-CM | POA: Diagnosis not present

## 2023-06-24 DIAGNOSIS — J3089 Other allergic rhinitis: Secondary | ICD-10-CM

## 2023-06-24 DIAGNOSIS — L409 Psoriasis, unspecified: Secondary | ICD-10-CM

## 2023-06-24 DIAGNOSIS — M79641 Pain in right hand: Secondary | ICD-10-CM

## 2023-06-24 DIAGNOSIS — M5030 Other cervical disc degeneration, unspecified cervical region: Secondary | ICD-10-CM

## 2023-06-24 DIAGNOSIS — E785 Hyperlipidemia, unspecified: Secondary | ICD-10-CM

## 2023-06-24 DIAGNOSIS — Z840 Family history of diseases of the skin and subcutaneous tissue: Secondary | ICD-10-CM

## 2023-06-24 DIAGNOSIS — M79642 Pain in left hand: Secondary | ICD-10-CM

## 2023-06-24 DIAGNOSIS — M546 Pain in thoracic spine: Secondary | ICD-10-CM

## 2023-06-24 DIAGNOSIS — Z8261 Family history of arthritis: Secondary | ICD-10-CM

## 2023-06-24 DIAGNOSIS — Q6671 Congenital pes cavus, right foot: Secondary | ICD-10-CM

## 2023-06-24 DIAGNOSIS — R5383 Other fatigue: Secondary | ICD-10-CM | POA: Diagnosis not present

## 2023-06-24 DIAGNOSIS — M7711 Lateral epicondylitis, right elbow: Secondary | ICD-10-CM

## 2023-06-24 DIAGNOSIS — N200 Calculus of kidney: Secondary | ICD-10-CM

## 2023-06-24 DIAGNOSIS — Z1159 Encounter for screening for other viral diseases: Secondary | ICD-10-CM | POA: Diagnosis not present

## 2023-06-24 DIAGNOSIS — K5730 Diverticulosis of large intestine without perforation or abscess without bleeding: Secondary | ICD-10-CM

## 2023-06-24 DIAGNOSIS — E0500 Thyrotoxicosis with diffuse goiter without thyrotoxic crisis or storm: Secondary | ICD-10-CM

## 2023-06-24 DIAGNOSIS — Q6672 Congenital pes cavus, left foot: Secondary | ICD-10-CM

## 2023-06-24 NOTE — Patient Instructions (Addendum)
Risankizumab Injection What is this medication? RISANKIZUMAB (RIS an KIZ ue mab) treats autoimmune conditions, such as psoriasis, arthritis, Crohn disease, and ulcerative colitis. It works by slowing down an overactive immune system.  It is a monoclonal antibody. This medicine may be used for other purposes; ask your health care provider or pharmacist if you have questions. COMMON BRAND NAME(S): Skyrizi What should I tell my care team before I take this medication? They need to know if you have any of these conditions: Hepatic disease Immune system problems Infection, such as tuberculosis (TB), bacterial, fungal or viral infections Recent or upcoming vaccine An unusual or allergic reaction to risankizumab, other medications, foods, dyes, or preservatives Pregnant or trying to get pregnant Breast-feeding How should I use this medication? This medication is injected into a vein or under the skin. It is given by your care team in a hospital or clinic setting. It may also be given at home. If you get this medication at home, you will be taught how to prepare and give it. Use exactly as directed. Take it as directed on the prescription label. Keep taking it unless your care team tells you to stop. If you use a pen, be sure to take off the outer needle cover before using the dose. It is important that you put your used needles and syringes in a special sharps container. Do not put them in a trash can. If you do not have a sharps container, call your pharmacist or care team to get one. A special MedGuide will be given to you by the pharmacist with each prescription and refill. Be sure to read this information carefully each time. This medication comes with INSTRUCTIONS FOR USE. Ask your pharmacist for directions on how to use this medication. Read the information carefully. Talk to your pharmacist or care team if you have questions. Talk to your care team about the use of this medication in children.  Special care may be needed. Overdosage: If you think you have taken too much of this medicine contact a poison control center or emergency room at once. NOTE: This medicine is only for you. Do not share this medicine with others. What if I miss a dose? It is important not to miss any doses. Talk to your care team about what to do if you miss a dose. What may interact with this medication? Do not take this medication with any of the following: Live vaccines This list may not describe all possible interactions. Give your health care provider a list of all the medicines, herbs, non-prescription drugs, or dietary supplements you use. Also tell them if you smoke, drink alcohol, or use illegal drugs. Some items may interact with your medicine. What should I watch for while using this medication? Visit your care team for regular checks on your progress. Tell your care team if your symptoms do not start to get better or if they get worse. You will be tested for tuberculosis (TB) before you start this medication. If your care team prescribes any medication for TB, you should start taking the TB medication before starting this medication. Make sure to finish the full course of TB medication. This medication may increase your risk of getting an infection. Call your care team for advice if you get a fever, chills, sore throat, or other symptoms of a cold or flu. Do not treat yourself. Try to avoid being around people who are sick. This medication can decrease the response to a vaccine. If you  need to get vaccinated, tell your care team if you have received this medication. Extra booster doses may be needed. Talk to your care team to see if a different vaccination schedule is needed. What side effects may I notice from receiving this medication? Side effects that you should report to your care team as soon as possible: Allergic reactions--skin rash, itching, hives, swelling of the face, lips, tongue, or  throat Infection--fever, chills, cough, sore throat, wounds that don't heal, pain or trouble when passing urine, general feeling of discomfort or being unwell Liver injury--right upper belly pain, loss of appetite, nausea, light-colored stool, dark yellow or brown urine, yellowing skin or eyes, unusual weakness or fatigue Side effects that usually do not require medical attention (report to your care team if they continue or are bothersome): Fatigue Headache Pain, redness, or irritation at injection site Runny or stuffy nose Sore throat This list may not describe all possible side effects. Call your doctor for medical advice about side effects. You may report side effects to FDA at 1-800-FDA-1088. Where should I keep my medication? Keep out of the reach of children and pets. Store in a refrigerator. Do not freeze. Protect from light. Keep it in the original carton until you are ready to take it. See product for storage information. Each product may have different instructions. Remove the dose from the carton about 30 to 45 minutes before it is time for you to take it. Get rid of any unused medication after the expiration date. To get rid of medications that are no longer needed or have expired: Take the medication to a medication take-back program. Check with your pharmacy or law enforcement to find a location. If you cannot return the medication, ask your pharmacist or care team how to get rid of this medication safely. NOTE: This sheet is a summary. It may not cover all possible information. If you have questions about this medicine, talk to your doctor, pharmacist, or health care provider.  2024 Elsevier/Gold Standard (2023-05-01 00:00:00)  Standing Labs We placed an order today for your standing lab work.   Please have your standing labs drawn in 1 month after starting Skyrizi and then every 3 months  Please have your labs drawn 2 weeks prior to your appointment so that the provider can  discuss your lab results at your appointment, if possible.  Please note that you may see your imaging and lab results in MyChart before we have reviewed them. We will contact you once all results are reviewed. Please allow our office up to 72 hours to thoroughly review all of the results before contacting the office for clarification of your results.  WALK-IN LAB HOURS  Monday through Thursday from 8:00 am -12:30 pm and 1:00 pm-5:00 pm and Friday from 8:00 am-12:00 pm.  Patients with office visits requiring labs will be seen before walk-in labs.  You may encounter longer than normal wait times. Please allow additional time. Wait times may be shorter on  Monday and Thursday afternoons.  We do not book appointments for walk-in labs. We appreciate your patience and understanding with our staff.   Labs are drawn by [facility]. Please bring your co-pay at the time of your lab draw.  You may receive a bill from [facility] for your lab work.  Please note if you are on Hydroxychloroquine and and an order has been placed for a Hydroxychloroquine level,  you will need to have it drawn 4 hours or more after your last dose.  If you wish to have your labs drawn at another location, please call the office 24 hours in advance so we can fax the orders.  The office is located at [address], [place], [place] [address]   If you have any questions regarding directions or hours of operation,  please call [phone].   As a reminder, please drink plenty of water prior to coming for your lab work. Thanks!   Vaccines You are taking a medication(s) that can suppress your immune system.  The following immunizations are recommended: Flu annually Covid-19  RSV Td/Tdap (tetanus, diphtheria, pertussis) every 10 years Pneumonia (Prevnar 15 then Pneumovax 23 at least 1 year apart.  Alternatively, can take Prevnar 20 without needing additional dose) Shingrix: 2 doses from 4 weeks to 6 months apart  Please  check with your PCP to make sure you are up to date.  If you have signs or symptoms of an infection or start antibiotics: First, call your PCP for workup of your infection. Hold your medication through the infection, until you complete your antibiotics, and until symptoms resolve if you take the following: Injectable medication (Actemra, Benlysta, Cimzia, Cosentyx, Enbrel, Humira, Kevzara, Orencia, Remicade, Simponi, Stelara, Taltz, Tremfya) Methotrexate Leflunomide (Arava) Mycophenolate (Cellcept) Harriette Ohara, Olumiant, or Rinvoq

## 2023-06-24 NOTE — Progress Notes (Signed)
Pharmacy Note  Subjective: Patient presents today to [facility] for follow up office visit.  Patient seen by the pharmacist for counseling on Skyrizi for psoriatic arthritis and plaque psoriasis.  Prior therapy includes:Humira (significantly worsened her plaque psoriasis). Has not taken MTX and leflunomide in past per her recollection. She started Humira as initial DMARD treatment due to aggressiveness of her psoriasis   Objective:  CBC    Component Value Date/Time   WBC 7.6 04/27/2018 0446   RBC 3.92 04/27/2018 0446   HGB 11.0 (L) 04/27/2018 0446   HCT 35.1 (L) 04/27/2018 0446   PLT 300 04/27/2018 0446   MCV 89.5 04/27/2018 0446   MCH 28.1 04/27/2018 0446   MCHC 31.3 04/27/2018 0446   RDW 13.6 04/27/2018 0446   LYMPHSABS 1.9 04/27/2018 0446   MONOABS 0.5 04/27/2018 0446   EOSABS 0.5 04/27/2018 0446   BASOSABS 0.0 04/27/2018 0446     CMP     Component Value Date/Time   NA 138 04/27/2018 0446   K 3.3 (L) 04/27/2018 0446   CL 106 04/27/2018 0446   CO2 24 04/27/2018 0446   GLUCOSE 137 (H) 04/27/2018 0446   BUN 10 04/27/2018 0446   CREATININE 0.82 04/27/2018 0446   CALCIUM 8.4 (L) 04/27/2018 0446   PROT 6.7 04/27/2018 0446   ALBUMIN 3.7 04/27/2018 0446   AST 16 04/27/2018 0446   ALT 15 04/27/2018 0446   ALKPHOS 70 04/27/2018 0446   BILITOT 0.5 04/27/2018 0446   GFRNONAA >60 04/27/2018 0446   GFRAA >60 04/27/2018 0446     Baseline Immunosuppressant Therapy Labs TB GOLD   Hepatitis Panel   HIV Lab Results  Component Value Date   HIV Non Reactive 04/27/2018   Immunoglobulins   SPEP    Latest Ref Rng & Units 04/27/2018    4:46 AM  Serum Protein Electrophoresis  Total Protein 6.5 - 8.1 g/dL 6.7    Chest x-ray: 08/65/7846 - No active cardiopulmonary disease.  Assessment/Plan:  Patient has spinal involvement with PsA so conventional DMARDs will not be effective.  Counseled patient that Cristy Folks is a IL-23 inhibitor.  Counseled patient on  purpose, proper use, and adverse effects of Skyrizi.  Reviewed the most common adverse effects including infection, URTIs, headache, fatigue, tinea infections, and injection site. Counseled patient that Cristy Folks should be held for infection and prior to scheduled surgery.  Recommend annual influenza, PCV 15 or PCV20 or Pneumovax 23, and Shingrix as indicated. Advised that live vaccines should be avoided while taking Skyrizi.  Reviewed the importance of regular labs while on Skyrizi therapy.  Will monitor CBC and CMP 1 month after starting and then every 3 months routinely thereafter. Will monitor TB gold annually. Standing orders placed.  Provided patient with medication education material and answered all questions.  Patient voiced understanding.  Patient consented to [place].  Will upload consent into the media tab.  Reviewed storage instructions of Skyrizi.  Advised initial injection must be administered in office.  Patient verbalized understanding.  Will apply for Skyrizi through [employer] and update when we receive a response.  Dose will be Skyrizi 150 mg subcuteanously at week 0, week 4 then every 12 weeks thereafter.  Prescription pending lab results and insurance approval.  Chesley Mires, PharmD, MPH, BCPS, CPP Clinical Pharmacist (Rheumatology and Pulmonology)

## 2023-06-25 ENCOUNTER — Telehealth: Payer: Self-pay | Admitting: Pharmacist

## 2023-06-25 DIAGNOSIS — B078 Other viral warts: Secondary | ICD-10-CM | POA: Diagnosis not present

## 2023-06-25 NOTE — Telephone Encounter (Signed)
Submitted a Prior Authorization request to [employer] for SKYRIZI SQ via CoverMyMeds. Will update once we receive a response.  Key: [id number]

## 2023-06-25 NOTE — Telephone Encounter (Signed)
-----   Message from [place] sent at 06/24/2023 11:35 AM EST ----- Please start Skyrizi SQ BIV.  Dose: 150mg  at Week 0, Week 4, and every 12 weeks  Dx: Psoriatic arthritis (L40.5) and Psoriasis (L40.9)  Previously tried therapies: Humira (worsened psoriasis; on allergy list)  Therapies patient unable to try: MTX and leflunomide (drinks alcohol twice weekly and also has spinal involvement so would not be effective)  Chesley Mires, PharmD, MPH, BCPS, CPP Clinical Pharmacist (Rheumatology and Pulmonology)

## 2023-06-28 LAB — PROTEIN ELECTROPHORESIS, SERUM, WITH REFLEX
Albumin ELP: 4.8 g/dL (ref 3.8–4.8)
Alpha 1: 0.3 g/dL (ref 0.2–0.3)
Alpha 2: 0.8 g/dL (ref 0.5–0.9)
Beta 2: 0.5 g/dL (ref 0.2–0.5)
Beta Globulin: 0.5 g/dL (ref 0.4–0.6)
Gamma Globulin: 1.4 g/dL (ref 0.8–1.7)
Total Protein: 8.3 g/dL — ABNORMAL HIGH (ref 6.1–8.1)

## 2023-06-28 LAB — CBC WITH DIFFERENTIAL/PLATELET
Absolute Lymphocytes: 2050 {cells}/uL (ref 850–3900)
Absolute Monocytes: 299 {cells}/uL (ref 200–950)
Basophils Absolute: 43 {cells}/uL (ref 0–200)
Basophils Relative: 0.7 %
Eosinophils Absolute: 61 {cells}/uL (ref 15–500)
Eosinophils Relative: 1 %
HCT: 41.1 % (ref 35.0–45.0)
Hemoglobin: 14.2 g/dL (ref 11.7–15.5)
MCH: 31.2 pg (ref 27.0–33.0)
MCHC: 34.5 g/dL (ref 32.0–36.0)
MCV: 90.3 fL (ref 80.0–100.0)
MPV: 9.8 fL (ref 7.5–12.5)
Monocytes Relative: 4.9 %
Neutro Abs: 3648 {cells}/uL (ref 1500–7800)
Neutrophils Relative %: 59.8 %
Platelets: 415 10*3/uL — ABNORMAL HIGH (ref 140–400)
RBC: 4.55 10*6/uL (ref 3.80–5.10)
RDW: 12.3 % (ref 11.0–15.0)
Total Lymphocyte: 33.6 %
WBC: 6.1 10*3/uL (ref 3.8–10.8)

## 2023-06-28 LAB — COMPLETE METABOLIC PANEL WITH GFR
AG Ratio: 1.4 (calc) (ref 1.0–2.5)
ALT: 13 U/L (ref 6–29)
AST: 16 U/L (ref 10–35)
Albumin: 4.7 g/dL (ref 3.6–5.1)
Alkaline phosphatase (APISO): 86 U/L (ref 37–153)
BUN: 9 mg/dL (ref 7–25)
CO2: 23 mmol/L (ref 20–32)
Calcium: 9.9 mg/dL (ref 8.6–10.4)
Chloride: 100 mmol/L (ref 98–110)
Creat: 0.83 mg/dL (ref 0.50–1.03)
Globulin: 3.4 g/dL (ref 1.9–3.7)
Glucose, Bld: 86 mg/dL (ref 65–99)
Potassium: 4.3 mmol/L (ref 3.5–5.3)
Sodium: 138 mmol/L (ref 135–146)
Total Bilirubin: 0.4 mg/dL (ref 0.2–1.2)
Total Protein: 8.1 g/dL (ref 6.1–8.1)
eGFR: 85 mL/min/{1.73_m2} (ref >=60)

## 2023-06-28 LAB — CYCLIC CITRUL PEPTIDE ANTIBODY, IGG: Cyclic Citrullin Peptide Ab: <16 U

## 2023-06-28 LAB — VITAMIN D 25 HYDROXY (VIT D DEFICIENCY, FRACTURES): Vit D, 25-Hydroxy: 35 ng/mL (ref 30–100)

## 2023-06-28 LAB — QUANTIFERON-TB GOLD PLUS
Mitogen-NIL: 7.98 [IU]/mL
NIL: 0.02 [IU]/mL
QuantiFERON-TB Gold Plus: NEGATIVE
TB1-NIL: 0 [IU]/mL
TB2-NIL: 0 [IU]/mL

## 2023-06-28 LAB — TSH: TSH: 1.7 m[IU]/L

## 2023-06-28 LAB — ANTI-NUCLEAR AB-TITER (ANA TITER): ANA Titer 1: 1:40 {titer} — ABNORMAL HIGH

## 2023-06-28 LAB — SEDIMENTATION RATE: Sed Rate: 29 mm/h (ref 0–30)

## 2023-06-28 LAB — ANA: Anti Nuclear Antibody (ANA): POSITIVE — AB

## 2023-06-28 LAB — HEPATITIS C ANTIBODY: Hepatitis C Ab: NONREACTIVE

## 2023-06-28 LAB — RHEUMATOID FACTOR: Rheumatoid fact SerPl-aCnc: <10 [IU]/mL (ref <14)

## 2023-06-28 LAB — IGG, IGA, IGM
IgG (Immunoglobin G), Serum: 1543 mg/dL (ref 600–1640)
IgM, Serum: 78 mg/dL (ref 50–300)
Immunoglobulin A: 396 mg/dL — ABNORMAL HIGH (ref 47–310)

## 2023-06-28 LAB — CK: Total CK: 81 U/L (ref 21–240)

## 2023-06-28 LAB — HEPATITIS B CORE ANTIBODY, IGM: Hep B C IgM: NONREACTIVE

## 2023-06-28 LAB — HEPATITIS B SURFACE ANTIGEN: Hepatitis B Surface Ag: NONREACTIVE

## 2023-06-30 NOTE — Telephone Encounter (Signed)
Received notification from [employer] regarding a prior authorization for SKYRIZI SQ. Authorization has been APPROVED from 05/26/23 to 12/22/23.   Unable to run test claim because patient must fill through [facility]: [phone]  Authorization # [id number]  Enrolled patient into Skyrizi SQ copay card: ID: [id number] Issued:06/30/2023 Rx GROUP: [id number] Rx BIN: [id number] Rx PCN: [id number]  ATC patient to schedule Skyrizi new start. Unable to reach. Left VM.  Chesley Mires, PharmD, MPH, BCPS, CPP Clinical Pharmacist (Rheumatology and Pulmonology)

## 2023-07-01 ENCOUNTER — Telehealth: Payer: Self-pay | Admitting: *Deleted

## 2023-07-01 NOTE — Telephone Encounter (Signed)
Patient contacted the office and left message stating she was returning a call to schedule her appointment for Allison Knox. Patient states the best daytime phone number is [phone].

## 2023-07-03 NOTE — Telephone Encounter (Signed)
Patient scheduled for Skyrizi new start on 07/07/23

## 2023-07-07 ENCOUNTER — Ambulatory Visit: Payer: BC Managed Care – PPO | Admitting: Pharmacist

## 2023-07-07 NOTE — Progress Notes (Deleted)
 Pharmacy Note  Subjective:   Patient presents to clinic today to receive first dose of Skyrizi  for PsA.   Patient was previously on Humira. No history of methotrexate or Leflunomide use.   Patient running a fever or have signs/symptoms of infection? {yes/no:20286}  Patient currently on antibiotics for the treatment of infection? {yes/no:20286}  Patient have any upcoming invasive procedures/surgeries? {yes/no:20286}  Objective: CMP     Component Value Date/Time   NA 138 06/24/2023 1132   K 4.3 06/24/2023 1132   CL 100 06/24/2023 1132   CO2 23 06/24/2023 1132   GLUCOSE 86 06/24/2023 1132   BUN 9 06/24/2023 1132   CREATININE 0.83 06/24/2023 1132   CALCIUM 9.9 06/24/2023 1132   PROT 8.1 06/24/2023 1132   PROT 8.3 (H) 06/24/2023 1132   ALBUMIN 3.7 04/27/2018 0446   AST 16 06/24/2023 1132   ALT 13 06/24/2023 1132   ALKPHOS 70 04/27/2018 0446   BILITOT 0.4 06/24/2023 1132   GFRNONAA >60 04/27/2018 0446   GFRAA >60 04/27/2018 0446    CBC    Component Value Date/Time   WBC 6.1 06/24/2023 1132   RBC 4.55 06/24/2023 1132   HGB 14.2 06/24/2023 1132   HCT 41.1 06/24/2023 1132   PLT 415 (H) 06/24/2023 1132   MCV 90.3 06/24/2023 1132   MCH 31.2 06/24/2023 1132   MCHC 34.5 06/24/2023 1132   RDW 12.3 06/24/2023 1132   LYMPHSABS 1.9 04/27/2018 0446   MONOABS 0.5 04/27/2018 0446   EOSABS 61 06/24/2023 1132   BASOSABS 43 06/24/2023 1132    Baseline Immunosuppressant Therapy Labs TB GOLD    Latest Ref Rng & Units 06/24/2023   11:32 AM  Quantiferon TB Gold  Quantiferon TB Gold Plus NEGATIVE NEGATIVE    Hepatitis Panel    Latest Ref Rng & Units 06/24/2023   11:32 AM  Hepatitis  Hep B Surface Ag NON-REACTIVE NON-REACTIVE   Hep B IgM NON-REACTIVE NON-REACTIVE   Hep C Ab NON-REACTIVE NON-REACTIVE    HIV Lab Results  Component Value Date   HIV Non Reactive 04/27/2018   Immunoglobulins    Latest Ref Rng & Units 06/24/2023   11:32 AM  Immunoglobulin Electrophoresis   IgA  47 - 310 mg/dL 603   IgG 399 - 8,359 mg/dL 8,456   IgM 50 - 699 mg/dL 78    SPEP    Latest Ref Rng & Units 06/24/2023   11:32 AM  Serum Protein Electrophoresis  Total Protein 6.1 - 8.1 g/dL 6.1 - 8.1 g/dL 8.1    8.3   Albumin 3.8 - 4.8 g/dL 4.8   Alpha-1 0.2 - 0.3 g/dL 0.3   Alpha-2 0.5 - 0.9 g/dL 0.8   Beta Globulin 0.4 - 0.6 g/dL 0.5   Beta 2 0.2 - 0.5 g/dL 0.5   Gamma Globulin 0.8 - 1.7 g/dL 1.4   Interpretation  --    G6PD No results found for: G6PDH TPMT No results found for: TPMT    Assessment/Plan:  Reviewed importance of holding Skyrizi  with signs/symptoms of an infections, if antibiotics are prescribed to treat an active infection, and with invasive procedures  Demonstrated proper injection technique with Skyrizi  demo device  Patient able to demonstrate proper injection technique using the teach back method.  Patient self injected in the {injsitedsg:28167} with:  Sample Medication: *** NDC: *** Lot: *** Expiration: ***  Patient tolerated well.  Observed for 30 mins in office for adverse reaction. {injectionreaction:30756}  Patient is to return in 1 month for  labs and 6-8 weeks for follow-up appointment.  Standing orders for CBC/CMP placed for 1 month, then every 3 months thereafter.  TB gold will be monitored yearly.    *** approved through insurance .   Rx sent to: [facility]: [phone].  Patient provided with pharmacy phone number and advised to call later this week to schedule shipment to home.  Patient will continue Skyrizi  150 mg subcut at week 4, then every 12 weeks thereafter.   All questions encouraged and answered.  Instructed patient to call with any further questions or concerns.  Tolu Nahzir Pohle, PharmD [facility] PGY-1   07/07/2023 7:04 AM

## 2023-07-07 NOTE — Telephone Encounter (Signed)
Patient no-show to [facility] new start today. MyChart message sent to pt

## 2023-07-08 ENCOUNTER — Ambulatory Visit: Payer: BC Managed Care – PPO | Attending: Rheumatology | Admitting: Pharmacist

## 2023-07-08 DIAGNOSIS — L4050 Arthropathic psoriasis, unspecified: Secondary | ICD-10-CM

## 2023-07-08 DIAGNOSIS — L409 Psoriasis, unspecified: Secondary | ICD-10-CM

## 2023-07-08 DIAGNOSIS — Z79899 Other long term (current) drug therapy: Secondary | ICD-10-CM

## 2023-07-08 DIAGNOSIS — Z7189 Other specified counseling: Secondary | ICD-10-CM

## 2023-07-08 MED ORDER — SKYRIZI PEN 150 MG/ML ~~LOC~~ SOAJ: SUBCUTANEOUS | 0 refills | Status: DC

## 2023-07-08 NOTE — Progress Notes (Signed)
 Pharmacy Note  Subjective:   Patient presents to clinic today to receive first dose of Skyrizi  SQ for psoriatic arthritis and psoriasis. She took Humira in past through derm for about 2 months - lead to significant worsening of psoriasis (including developing pustular psoriasis)  Patient running a fever or have signs/symptoms of infection? No  Patient currently on antibiotics for the treatment of infection? No  Patient have any upcoming invasive procedures/surgeries? No  Objective: CMP     Component Value Date/Time   NA 138 06/24/2023 1132   K 4.3 06/24/2023 1132   CL 100 06/24/2023 1132   CO2 23 06/24/2023 1132   GLUCOSE 86 06/24/2023 1132   BUN 9 06/24/2023 1132   CREATININE 0.83 06/24/2023 1132   CALCIUM 9.9 06/24/2023 1132   PROT 8.1 06/24/2023 1132   PROT 8.3 (H) 06/24/2023 1132   ALBUMIN 3.7 04/27/2018 0446   AST 16 06/24/2023 1132   ALT 13 06/24/2023 1132   ALKPHOS 70 04/27/2018 0446   BILITOT 0.4 06/24/2023 1132   GFRNONAA >60 04/27/2018 0446   GFRAA >60 04/27/2018 0446    CBC    Component Value Date/Time   WBC 6.1 06/24/2023 1132   RBC 4.55 06/24/2023 1132   HGB 14.2 06/24/2023 1132   HCT 41.1 06/24/2023 1132   PLT 415 (H) 06/24/2023 1132   MCV 90.3 06/24/2023 1132   MCH 31.2 06/24/2023 1132   MCHC 34.5 06/24/2023 1132   RDW 12.3 06/24/2023 1132   LYMPHSABS 1.9 04/27/2018 0446   MONOABS 0.5 04/27/2018 0446   EOSABS 61 06/24/2023 1132   BASOSABS 43 06/24/2023 1132    Baseline Immunosuppressant Therapy Labs TB GOLD    Latest Ref Rng & Units 06/24/2023   11:32 AM  Quantiferon TB Gold  Quantiferon TB Gold Plus NEGATIVE NEGATIVE    Hepatitis Panel    Latest Ref Rng & Units 06/24/2023   11:32 AM  Hepatitis  Hep B Surface Ag NON-REACTIVE NON-REACTIVE   Hep B IgM NON-REACTIVE NON-REACTIVE   Hep C Ab NON-REACTIVE NON-REACTIVE    HIV Lab Results  Component Value Date   HIV Non Reactive 04/27/2018   Immunoglobulins    Latest Ref Rng & Units  06/24/2023   11:32 AM  Immunoglobulin Electrophoresis  IgA  47 - 310 mg/dL 603   IgG 399 - 8,359 mg/dL 8,456   IgM 50 - 699 mg/dL 78    SPEP    Latest Ref Rng & Units 06/24/2023   11:32 AM  Serum Protein Electrophoresis  Total Protein 6.1 - 8.1 g/dL 6.1 - 8.1 g/dL 8.1    8.3   Albumin 3.8 - 4.8 g/dL 4.8   Alpha-1 0.2 - 0.3 g/dL 0.3   Alpha-2 0.5 - 0.9 g/dL 0.8   Beta Globulin 0.4 - 0.6 g/dL 0.5   Beta 2 0.2 - 0.5 g/dL 0.5   Gamma Globulin 0.8 - 1.7 g/dL 1.4   Interpretation  --    Chest x-ray: 04/26/2018  Assessment/Plan:  Reviewed importance of holding Skyrizi  with signs/symptoms of an infections, if antibiotics are prescribed to treat an active infection, and with invasive procedures  Demonstrated proper injection technique with Skyrizi  pen demo device  Patient able to demonstrate proper injection technique using the teach back method.  Patient self injected in the right lower abdomen with:  Sample Medication: Skyrizi  150mg /mL pen injector NDC: [address]-29 Lot: [id number] Expiration: 07/2024  Patient tolerated well.  Observed for 30 mins in office for adverse reaction. Patient denies itchiness and  irritation at injection., No swelling or redness noted., and Reviewed injection site reaction management with patient verbally and printed information for review in AVS  Patient is to return in 1 month for labs and 6-8 weeks for follow-up appointment.  Standing orders for CBC/CMP placed.  TB gold will be monitored yearly.   Patient sees Rocky Pereyra for PsO management.  Skyrizi  SQ approved through insurance .   Rx sent to: [facility]: [phone].  Patient provided with pharmacy phone number and advised to call after 7-10 business days week to schedule shipment to home.  Patient will continue Skyrizi  SQ 150mg  SQ at Week 0 (administered in clinic today), Week 4, then every 12 weeks thereafter.  All questions encouraged and answered.  Instructed patient to call with  any further questions or concerns.  Sherry Pennant, PharmD, MPH, BCPS, CPP Clinical Pharmacist (Rheumatology and Pulmonology)  07/08/2023 8:04 AM

## 2023-07-08 NOTE — Patient Instructions (Signed)
 Your next SKYRIZI  SQ dose is due on 08/05/2023, then every 12 weeks (starting on 10/28/23)  HOLD SKYRIZI  SQ if you have signs or symptoms of an infection. You can resume once you feel better or back to your baseline. HOLD SKYRIZI  SQ if you start antibiotics to treat an infection. HOLD SKYRIZI  SQ around the time of surgery/procedures. Your surgeon will be able to provide recommendations on when to hold BEFORE and when you are cleared to RESUME.  Pharmacy information: Your prescription will be shipped from [facility]. Their phone number is [phone] Please call to schedule shipment and confirm address. They will mail your medication to your home.  Cost information: Your copay should be affordable. If you call the pharmacy and it is not affordable, please double-check that they are billing through your copay card as secondary coverage. That copay card information is: ID: [id number] Rx GROUP: [id number] Rx BIN: [id number] Rx PCN: [id number] Issued:06/30/2023  Labs are due in 1 month then every 3 months. Lab hours are from Monday to Thursday 8am-12:30pm and 1pm-5pm and Friday 8am-12pm. You do not need an appointment if you come for labs during these times. If you'd like to go to a [facility] or [facility] closer to home, please call our clinic 48 hours prior to lab date so we can release orders in a timely manner.  Stay up to date on all routine vaccines: influenza, pneumonia, COVID19, Shingles  How to manage an injection site reaction: Remember the 5 C's: COUNTER - leave on the counter at least 30 minutes but up to overnight to bring medication to room temperature. This may help prevent stinging COLD - place something cold (like an ice gel pack or cold water bottle) on the injection site just before cleansing with alcohol. This may help reduce pain CLARITIN - use Claritin (generic name is loratadine) for the first two weeks of treatment or the day of, the day before, and the day after injecting. This  will help to minimize injection site reactions CORTISONE CREAM - apply if injection site is irritated and itching CALL ME - if injection site reaction is bigger than the size of your fist, looks infected, blisters, or if you develop hives

## 2023-07-29 ENCOUNTER — Ambulatory Visit: Payer: BC Managed Care – PPO | Admitting: Rheumatology

## 2023-07-30 DIAGNOSIS — F4322 Adjustment disorder with anxiety: Secondary | ICD-10-CM | POA: Diagnosis not present

## 2023-08-05 ENCOUNTER — Other Ambulatory Visit: Payer: Self-pay | Admitting: *Deleted

## 2023-08-05 DIAGNOSIS — Z79899 Other long term (current) drug therapy: Secondary | ICD-10-CM

## 2023-08-06 LAB — CBC WITH DIFFERENTIAL/PLATELET
Absolute Lymphocytes: 2375 {cells}/uL (ref 850–3900)
Absolute Monocytes: 290 {cells}/uL (ref 200–950)
Basophils Absolute: 32 {cells}/uL (ref 0–200)
Basophils Relative: 0.5 %
Eosinophils Absolute: 38 {cells}/uL (ref 15–500)
Eosinophils Relative: 0.6 %
HCT: 38.7 % (ref 35.0–45.0)
Hemoglobin: 12.7 g/dL (ref 11.7–15.5)
MCH: 29.9 pg (ref 27.0–33.0)
MCHC: 32.8 g/dL (ref 32.0–36.0)
MCV: 91.1 fL (ref 80.0–100.0)
MPV: 9.7 fL (ref 7.5–12.5)
Monocytes Relative: 4.6 %
Neutro Abs: 3566 {cells}/uL (ref 1500–7800)
Neutrophils Relative %: 56.6 %
Platelets: 335 10*3/uL (ref 140–400)
RBC: 4.25 10*6/uL (ref 3.80–5.10)
RDW: 12.8 % (ref 11.0–15.0)
Total Lymphocyte: 37.7 %
WBC: 6.3 10*3/uL (ref 3.8–10.8)

## 2023-08-06 LAB — COMPLETE METABOLIC PANEL WITH GFR
AG Ratio: 1.5 (calc) (ref 1.0–2.5)
ALT: 14 U/L (ref 6–29)
AST: 16 U/L (ref 10–35)
Albumin: 4.6 g/dL (ref 3.6–5.1)
Alkaline phosphatase (APISO): 75 U/L (ref 37–153)
BUN: 13 mg/dL (ref 7–25)
CO2: 28 mmol/L (ref 20–32)
Calcium: 9.8 mg/dL (ref 8.6–10.4)
Chloride: 103 mmol/L (ref 98–110)
Creat: 0.84 mg/dL (ref 0.50–1.03)
Globulin: 3 g/dL (ref 1.9–3.7)
Glucose, Bld: 98 mg/dL (ref 65–99)
Potassium: 4 mmol/L (ref 3.5–5.3)
Sodium: 139 mmol/L (ref 135–146)
Total Bilirubin: 0.5 mg/dL (ref 0.2–1.2)
Total Protein: 7.6 g/dL (ref 6.1–8.1)
eGFR: 84 mL/min/{1.73_m2} (ref >=60)

## 2023-08-06 NOTE — Progress Notes (Signed)
 CBC and CMP are normal.

## 2023-08-24 NOTE — Progress Notes (Signed)
 Office Visit Note  Patient: Allison Knox             Date of Birth: [birth year]           MRN: [id number]             PCP: Mila Palmer, MD Referring: Mila Palmer, MD Visit Date: 09/07/2023 Occupation: @GUAROCC @  Subjective:  Left TMJ pain  History of Present Illness: KORYN CHARLOT is a 53 y.o. female with psoriatic arthritis and psoriasis.  She returns today after her last visit on June 24, 2023.  She received her first Skyrizi injection on July 08, 2023.  She states she was delayed for the second injection by about 6 weeks.  She has noticed improvement in her joints and her skin.  She states none of her joints are painful now.  She has been exercising on a regular basis and is stretching.  She states that her right knee joint inflammation and swelling has resolved.  She has occasional discomfort in the trapezius region.  She had resolution of epicondylitis, trochanteric bursitis and SI joint pain.  She continues to have some discomfort in her left TMJ for which she has been seeing the dentist.  She was advised mouthguard.  She states that her skin has cleared up.  She has a small patch in the pubic region which is also improving.  She noticed a knot on her right wrist which is not painful.    Activities of Daily Living:  Patient reports morning stiffness for 0 minutes.   Patient Denies nocturnal pain.  Difficulty dressing/grooming: Denies Difficulty climbing stairs: Denies Difficulty getting out of chair: Denies Difficulty using hands for taps, buttons, cutlery, and/or writing: Denies  Review of Systems  Constitutional:  Negative for fatigue.  HENT:  Negative for mouth sores and mouth dryness.   Eyes:  Negative for dryness.  Respiratory:  Negative for shortness of breath and difficulty breathing.   Cardiovascular:  Negative for chest pain and palpitations.  Gastrointestinal:  Negative for blood in stool, constipation and diarrhea.  Endocrine: Negative for increased  urination.  Genitourinary:  Negative for involuntary urination.  Musculoskeletal:  Positive for joint pain and joint pain. Negative for gait problem, joint swelling, myalgias, muscle weakness, morning stiffness, muscle tenderness and myalgias.  Skin:  Negative for color change, rash, hair loss and sensitivity to sunlight.  Allergic/Immunologic: Negative for susceptible to infections.  Neurological:  Negative for dizziness and headaches.  Hematological:  Negative for swollen glands.  Psychiatric/Behavioral:  Negative for depressed mood and sleep disturbance. The patient is not nervous/anxious.     PMFS History:  Patient Active Problem List   Diagnosis Date Noted   Psoriatic arthritis (HCC) 06/24/2023   Psoriasis 06/24/2023   Graves disease 06/10/2023   Dyslipidemia 06/10/2023   Arthritis    Diverticula of colon 04/27/2018   Acute cholecystitis 04/26/2018   Hyponatremia 04/26/2018   Cervical dystonia 06/22/2016    Past Medical History:  Diagnosis Date   Abnormal vaginal bleeding    Arthritis    psoriatic arthritis dx April 2021   Asthma    seasonal   Ovarian cyst     Family History  Problem Relation Age of Onset   Gallstones Mother    Heart failure Mother    Dementia Mother    Diabetes Mother    Hypertension Mother    Lung cancer Father    Anxiety disorder Sister    Psoriasis Sister    Hypertension Brother  Gallstones Brother    Rheum arthritis Brother    Healthy Son    Healthy Daughter    Neuromuscular disorder Neg Hx    Past Surgical History:  Procedure Laterality Date   HAND SURGERY Right    HERNIA REPAIR     as a child   OVARIAN CYST REMOVAL     Social History   Social History Narrative   Lives with husband   Caffeine use: Hot tea (black tea) daily   Coffee sometimes    There is no immunization history on file for this patient.   Objective: Vital Signs: BP 108/71 (BP Location: Left Arm, Patient Position: Sitting, Cuff Size: Normal)   Pulse 61    Resp 14   Ht 5\' 6"  (1.676 m)   Wt 163 lb 3.2 oz (74 kg)   LMP 05/16/2018   BMI 26.34 kg/m    Physical Exam Vitals and nursing note reviewed.  Constitutional:      Appearance: She is well-developed.  HENT:     Head: Normocephalic and atraumatic.  Eyes:     Conjunctiva/sclera: Conjunctivae normal.  Cardiovascular:     Rate and Rhythm: Normal rate and regular rhythm.     Heart sounds: Normal heart sounds.  Pulmonary:     Effort: Pulmonary effort is normal.     Breath sounds: Normal breath sounds.  Abdominal:     General: Bowel sounds are normal.     Palpations: Abdomen is soft.  Musculoskeletal:     Cervical back: Normal range of motion.  Lymphadenopathy:     Cervical: No cervical adenopathy.  Skin:    General: Skin is warm and dry.     Capillary Refill: Capillary refill takes less than 2 seconds.  Neurological:     Mental Status: She is alert and oriented to person, place, and time.  Psychiatric:        Behavior: Behavior normal.      Musculoskeletal Exam: Cervical, thoracic and lumbar spine were in good range of motion.  She had no tenderness with range of motion of the cervical spine.  Mild left trapezius tightness was noted.  There was no tenderness over SI joints.  Shoulders, elbows, wrist joints, MCPs PIPs and DIPs were in good range of motion.  Mild DIP prominence with no synovitis was noted.  A cyst was palpable over the volar aspect of the right wrist.  Hip joints and knee joints in good range of motion without any warmth swelling or effusion.  There was no tenderness or swelling over ankles or MTPs.  There was no Achilles tendinitis or plantar fasciitis.  CDAI Exam: CDAI Score: -- Patient Global: --; Provider Global: -- Swollen: --; Tender: -- Joint Exam 09/07/2023   No joint exam has been documented for this visit   There is currently no information documented on the homunculus. Go to the Rheumatology activity and complete the homunculus joint  exam.  Investigation: No additional findings.  Imaging: No results found.  Recent Labs: Lab Results  Component Value Date   WBC 6.3 08/05/2023   HGB 12.7 08/05/2023   PLT 335 08/05/2023   NA 139 08/05/2023   K 4.0 08/05/2023   CL 103 08/05/2023   CO2 28 08/05/2023   GLUCOSE 98 08/05/2023   BUN 13 08/05/2023   CREATININE 0.84 08/05/2023   BILITOT 0.5 08/05/2023   ALKPHOS 70 04/27/2018   AST 16 08/05/2023   ALT 14 08/05/2023   PROT 7.6 08/05/2023   ALBUMIN 3.7 04/27/2018  CALCIUM 9.8 08/05/2023   GFRAA >60 04/27/2018   QFTBGOLDPLUS NEGATIVE 06/24/2023    Speciality Comments: Humira - worsened psoriasis (caused pustular diffuse psoriasis) Skyrizi started 07/08/23 Derm - Elpidio Anis  Procedures:  No procedures performed Allergies: Penicillins, Codeine, and Humira (2 pen) [adalimumab]   Assessment / Plan:     Visit Diagnoses: Psoriatic arthritis (HCC) - dxd 09/2019 by [place] rheum.  History of recurrent right knee joint effusion, arthritis in the left wrist and left MTPs.  She has been on [place] since July 08, 2023.  She has noticed improvement in most of her joint pain.  She has some discomfort in the left TMJ.  She is seeing a dentist and will be getting a mouthguard.  No synovitis was noted on the examination today.  No tendinitis was noted on the examination today.  Psoriasis-patient's most of her psoriasis is cleared up.  She has a small lesion in the pubic region.  High risk medication use - Skyrizi 150 mg sq started 07/08/23.  She had second dose 6 weeks later.  She will be getting Skyrizi 150 mg subcu every 12 weeks going forward.  Labs obtained on August 05, 2023 CBC and CMP were normal.  TB Gold was negative on June 24, 2023.  She was advised to get labs every 3 months to monitor for drug toxicity.  TB Gold will be obtained annually.  Information for immunization was placed in the AVS.  She was advised to hold Skyrizi if she develops an infection resume after the  infection resolves.  Pain in both hands-she had no synovitis on the examination today.  The pain in her hands has improved.  She states her left wrist is hurting some as she was blowing balloons last week for a party.  No synovitis was noted.  Chronic pain of right knee-she has had chronic pain and recurrent effusion in her right knee joint.  No warmth swelling or effusion was noted.  Patient denies pain today.  Pes cavus of both feet-using shoes with arch support was advised.  DDD (degenerative disc disease), cervical-she is range of motion without discomfort.  She has some left trapezius spasm.  Chronic right SI joint pain-she had no tenderness over the right SI joint.  She has been exercising and doing stretching exercises.  Dyslipidemia  Diverticula of colon  Vitamin D deficiency-D was 35 on June 24, 2023.  Postmenopausal-she is postmenopausal.  I discussed DEXA scan.  She plans to discuss it further with Dr. Paulino Rily.  Graves disease  Seasonal allergic rhinitis due to other allergic trigger  Family history of psoriatic arthritis-sister  Family history of rheumatoid arthritis-Brother  Orders: No orders of the defined types were placed in this encounter.  No orders of the defined types were placed in this encounter.    Follow-Up Instructions: Return in about 5 months (around 02/07/2024) for Psoriatic arthritis.   Pollyann Savoy, MD  Note - This record has been created using Animal nutritionist.  Chart creation errors have been sought, but may not always  have been located. Such creation errors do not reflect on  the standard of medical care.

## 2023-08-27 DIAGNOSIS — F4322 Adjustment disorder with anxiety: Secondary | ICD-10-CM | POA: Diagnosis not present

## 2023-09-03 ENCOUNTER — Ambulatory Visit: Payer: BC Managed Care – PPO | Admitting: Rheumatology

## 2023-09-07 ENCOUNTER — Ambulatory Visit: Attending: Rheumatology | Admitting: Rheumatology

## 2023-09-07 ENCOUNTER — Telehealth: Payer: Self-pay

## 2023-09-07 ENCOUNTER — Encounter: Payer: Self-pay | Admitting: Rheumatology

## 2023-09-07 VITALS — BP 108/71 | HR 61 | Resp 14 | Ht 66.0 in | Wt 163.2 lb

## 2023-09-07 DIAGNOSIS — M546 Pain in thoracic spine: Secondary | ICD-10-CM

## 2023-09-07 DIAGNOSIS — L409 Psoriasis, unspecified: Secondary | ICD-10-CM | POA: Diagnosis not present

## 2023-09-07 DIAGNOSIS — Z79899 Other long term (current) drug therapy: Secondary | ICD-10-CM | POA: Diagnosis not present

## 2023-09-07 DIAGNOSIS — M79641 Pain in right hand: Secondary | ICD-10-CM

## 2023-09-07 DIAGNOSIS — E785 Hyperlipidemia, unspecified: Secondary | ICD-10-CM

## 2023-09-07 DIAGNOSIS — Q6672 Congenital pes cavus, left foot: Secondary | ICD-10-CM

## 2023-09-07 DIAGNOSIS — M79642 Pain in left hand: Secondary | ICD-10-CM

## 2023-09-07 DIAGNOSIS — L4050 Arthropathic psoriasis, unspecified: Secondary | ICD-10-CM | POA: Diagnosis not present

## 2023-09-07 DIAGNOSIS — M7061 Trochanteric bursitis, right hip: Secondary | ICD-10-CM

## 2023-09-07 DIAGNOSIS — J3089 Other allergic rhinitis: Secondary | ICD-10-CM

## 2023-09-07 DIAGNOSIS — E0500 Thyrotoxicosis with diffuse goiter without thyrotoxic crisis or storm: Secondary | ICD-10-CM

## 2023-09-07 DIAGNOSIS — K5730 Diverticulosis of large intestine without perforation or abscess without bleeding: Secondary | ICD-10-CM

## 2023-09-07 DIAGNOSIS — M7711 Lateral epicondylitis, right elbow: Secondary | ICD-10-CM

## 2023-09-07 DIAGNOSIS — M25561 Pain in right knee: Secondary | ICD-10-CM

## 2023-09-07 DIAGNOSIS — Q6671 Congenital pes cavus, right foot: Secondary | ICD-10-CM

## 2023-09-07 DIAGNOSIS — G8929 Other chronic pain: Secondary | ICD-10-CM

## 2023-09-07 DIAGNOSIS — M5030 Other cervical disc degeneration, unspecified cervical region: Secondary | ICD-10-CM

## 2023-09-07 DIAGNOSIS — E559 Vitamin D deficiency, unspecified: Secondary | ICD-10-CM

## 2023-09-07 DIAGNOSIS — M533 Sacrococcygeal disorders, not elsewhere classified: Secondary | ICD-10-CM

## 2023-09-07 DIAGNOSIS — Z8261 Family history of arthritis: Secondary | ICD-10-CM

## 2023-09-07 DIAGNOSIS — Z840 Family history of diseases of the skin and subcutaneous tissue: Secondary | ICD-10-CM

## 2023-09-07 DIAGNOSIS — Z780 Asymptomatic menopausal state: Secondary | ICD-10-CM

## 2023-09-07 NOTE — Telephone Encounter (Signed)
 Attempted to contact patient and left message to advise patient that Skyrizi dosing will be every 12 weeks. Advised patient to call with any questions.

## 2023-09-07 NOTE — Addendum Note (Signed)
 Addended by: Ellen Henri on: 09/07/2023 08:41 AM   Modules accepted: Orders

## 2023-09-07 NOTE — Patient Instructions (Signed)
Standing Labs We placed an order today for your standing lab work.   Please have your standing labs drawn in June and every 3 months  Please have your labs drawn 2 weeks prior to your appointment so that the provider can discuss your lab results at your appointment, if possible.  Please note that you may see your imaging and lab results in [facility] before we have reviewed them. We will contact you once all results are reviewed. Please allow our office up to 72 hours to thoroughly review all of the results before contacting the office for clarification of your results.  WALK-IN LAB HOURS  Monday through Thursday from 8:00 am -12:30 pm and 1:00 pm-5:00 pm and Friday from 8:00 am-12:00 pm.  Patients with office visits requiring labs will be seen before walk-in labs.  You may encounter longer than normal wait times. Please allow additional time. Wait times may be shorter on  Monday and Thursday afternoons.  We do not book appointments for walk-in labs. We appreciate your patience and understanding with our staff.   Labs are drawn by [facility]. Please bring your co-pay at the time of your lab draw.  You may receive a bill from [facility] for your lab work.  Please note if you are on Hydroxychloroquine and and an order has been placed for a Hydroxychloroquine level,  you will need to have it drawn 4 hours or more after your last dose.  If you wish to have your labs drawn at another location, please call the office 24 hours in advance so we can fax the orders.  The office is located at [address], [place], [place], [place] [address]   If you have any questions regarding directions or hours of operation,  please call [phone].   As a reminder, please drink plenty of water prior to coming for your lab work. Thanks!  Vaccines You are taking a medication(s) that can suppress your immune system.  The following immunizations are recommended: Flu annually Covid-19  RSV Td/Tdap (tetanus,  diphtheria, pertussis) every 10 years Pneumonia (Prevnar 15 then Pneumovax 23 at least 1 year apart.  Alternatively, can take Prevnar 20 without needing additional dose) Shingrix: 2 doses from 4 weeks to 6 months apart  Please check with your PCP to make sure you are up to date.   If you have signs or symptoms of an infection or start antibiotics: First, call your PCP for workup of your infection. Hold your medication through the infection, until you complete your antibiotics, and until symptoms resolve if you take the following: Injectable medication (Actemra, Benlysta, Cimzia, Cosentyx, Enbrel, Humira, Kevzara, Orencia, Remicade, Simponi, Stelara, Taltz, Tremfya) Methotrexate Leflunomide (Arava) Mycophenolate (Cellcept) Morrie Sheldon, Olumiant, or Rinvoq

## 2023-10-06 DIAGNOSIS — M1990 Unspecified osteoarthritis, unspecified site: Secondary | ICD-10-CM | POA: Diagnosis not present

## 2023-10-06 DIAGNOSIS — R109 Unspecified abdominal pain: Secondary | ICD-10-CM | POA: Diagnosis not present

## 2023-10-26 ENCOUNTER — Other Ambulatory Visit: Payer: Self-pay | Admitting: Rheumatology

## 2023-10-26 DIAGNOSIS — L409 Psoriasis, unspecified: Secondary | ICD-10-CM

## 2023-10-26 DIAGNOSIS — L4050 Arthropathic psoriasis, unspecified: Secondary | ICD-10-CM

## 2023-10-26 DIAGNOSIS — Z79899 Other long term (current) drug therapy: Secondary | ICD-10-CM

## 2023-10-27 NOTE — Telephone Encounter (Signed)
 Last Fill: 07/08/2023  Labs: 08/05/2023 CBC and CMP are normal.   TB Gold: 06/24/2023 Neg    Next Visit: 02/08/2024  Last Visit: 09/07/2023  DX: Psoriatic arthritis   Current Dose per office note 09/07/2023: Skyrizi  150 mg subcu every 12 weeks   Okay to refill Skyrizi ?

## 2023-11-02 DIAGNOSIS — R1031 Right lower quadrant pain: Secondary | ICD-10-CM | POA: Diagnosis not present

## 2023-11-04 DIAGNOSIS — E7800 Pure hypercholesterolemia, unspecified: Secondary | ICD-10-CM | POA: Diagnosis not present

## 2023-11-04 DIAGNOSIS — Z0000 Encounter for general adult medical examination without abnormal findings: Secondary | ICD-10-CM | POA: Diagnosis not present

## 2023-11-04 DIAGNOSIS — Z8639 Personal history of other endocrine, nutritional and metabolic disease: Secondary | ICD-10-CM | POA: Diagnosis not present

## 2023-11-04 DIAGNOSIS — E559 Vitamin D deficiency, unspecified: Secondary | ICD-10-CM | POA: Diagnosis not present

## 2023-11-19 DIAGNOSIS — R1031 Right lower quadrant pain: Secondary | ICD-10-CM | POA: Diagnosis not present

## 2023-11-24 ENCOUNTER — Telehealth: Payer: Self-pay | Admitting: Pharmacist

## 2023-11-24 NOTE — Telephone Encounter (Signed)
 Received notification from [employer] regarding a prior authorization for SKYRIZI  SQ. Authorization has been APPROVED from 10/25/23 to 06/01/24.   Authorization # [id number]  Sherry Pennant, PharmD, MPH, BCPS, CPP Clinical Pharmacist (Rheumatology and Pulmonology)

## 2023-11-24 NOTE — Telephone Encounter (Signed)
 Submitted a Prior Authorization RENEWAL request to [employer] for SKYRIZI  SQ via CoverMyMeds. Will update once we receive a response.  Key: [id number]

## 2024-01-15 ENCOUNTER — Other Ambulatory Visit: Payer: Self-pay | Admitting: Rheumatology

## 2024-01-15 DIAGNOSIS — L409 Psoriasis, unspecified: Secondary | ICD-10-CM

## 2024-01-15 DIAGNOSIS — Z79899 Other long term (current) drug therapy: Secondary | ICD-10-CM

## 2024-01-15 DIAGNOSIS — L4050 Arthropathic psoriasis, unspecified: Secondary | ICD-10-CM

## 2024-01-15 NOTE — Telephone Encounter (Signed)
 Patient called the office back stating she would be in this week to get labs updated.

## 2024-01-15 NOTE — Telephone Encounter (Addendum)
 Last Fill: 10/27/2023  Labs: 08/05/2023 CBC and CMP are normal.   TB Gold: 06/24/2023 TB Gold Negative    Next Visit: 02/15/2024  Last Visit: 09/07/2023  IK:Ednmpjupr arthritis   Current Dose per office note 09/07/2023: Skyrizi  150 mg subcu every 12 weeks   Okay to refill Skyrizi ?   LMOM for patient to update labs in the office.

## 2024-01-18 ENCOUNTER — Other Ambulatory Visit: Payer: Self-pay | Admitting: *Deleted

## 2024-01-18 DIAGNOSIS — Z79899 Other long term (current) drug therapy: Secondary | ICD-10-CM

## 2024-01-19 ENCOUNTER — Ambulatory Visit: Payer: Self-pay | Admitting: Rheumatology

## 2024-01-19 LAB — CBC WITH DIFFERENTIAL/PLATELET
Absolute Lymphocytes: 1855 {cells}/uL (ref 850–3900)
Absolute Monocytes: 330 {cells}/uL (ref 200–950)
Basophils Absolute: 70 {cells}/uL (ref 0–200)
Basophils Relative: 1.4 %
Eosinophils Absolute: 60 {cells}/uL (ref 15–500)
Eosinophils Relative: 1.2 %
HCT: 37.6 % (ref 35.0–45.0)
Hemoglobin: 11.9 g/dL (ref 11.7–15.5)
MCH: 29.5 pg (ref 27.0–33.0)
MCHC: 31.6 g/dL — ABNORMAL LOW (ref 32.0–36.0)
MCV: 93.3 fL (ref 80.0–100.0)
MPV: 9.2 fL (ref 7.5–12.5)
Monocytes Relative: 6.6 %
Neutro Abs: 2685 {cells}/uL (ref 1500–7800)
Neutrophils Relative %: 53.7 %
Platelets: 317 Thousand/uL (ref 140–400)
RBC: 4.03 Million/uL (ref 3.80–5.10)
RDW: 13 % (ref 11.0–15.0)
Total Lymphocyte: 37.1 %
WBC: 5 Thousand/uL (ref 3.8–10.8)

## 2024-01-19 LAB — COMPREHENSIVE METABOLIC PANEL WITH GFR
AG Ratio: 1.6 (calc) (ref 1.0–2.5)
ALT: 19 U/L (ref 6–29)
AST: 18 U/L (ref 10–35)
Albumin: 4.5 g/dL (ref 3.6–5.1)
Alkaline phosphatase (APISO): 82 U/L (ref 37–153)
BUN: 16 mg/dL (ref 7–25)
CO2: 25 mmol/L (ref 20–32)
Calcium: 9.3 mg/dL (ref 8.6–10.4)
Chloride: 103 mmol/L (ref 98–110)
Creat: 0.8 mg/dL (ref 0.50–1.03)
Globulin: 2.8 g/dL (ref 1.9–3.7)
Glucose, Bld: 83 mg/dL (ref 65–99)
Potassium: 4.6 mmol/L (ref 3.5–5.3)
Sodium: 138 mmol/L (ref 135–146)
Total Bilirubin: 0.6 mg/dL (ref 0.2–1.2)
Total Protein: 7.3 g/dL (ref 6.1–8.1)
eGFR: 89 mL/min/1.73m2 (ref >=60)

## 2024-01-19 NOTE — Progress Notes (Signed)
CBC and CMP is stable.

## 2024-01-22 DIAGNOSIS — Z01419 Encounter for gynecological examination (general) (routine) without abnormal findings: Secondary | ICD-10-CM | POA: Diagnosis not present

## 2024-01-22 DIAGNOSIS — Z1231 Encounter for screening mammogram for malignant neoplasm of breast: Secondary | ICD-10-CM | POA: Diagnosis not present

## 2024-02-01 NOTE — Progress Notes (Signed)
 Office Visit Note  Patient: Allison Knox             Date of Birth: [birth year]           MRN: [id number]             PCP: Verena Mems, MD Referring: Verena Mems, MD Visit Date: 02/15/2024 Occupation: @GUAROCC @  Subjective:  Medication monitoring   History of Present Illness: Allison Knox is a 53 y.o. female with history of psoriatic arthritis.  Patient remains on Skyrizi  150 mg sq injections every 12 weeks.  Allison Knox denies any signs or symptoms of a flare.  Allison Knox denies any active psoriasis at this time.  Allison Knox has not had any recurrence of knee joint swelling.  Allison Knox denies any SI joint pain at this time.  Allison Knox has not had any Achilles tendinitis or plantar fasciitis.  Patient continues to experience trapezius muscle tension and tenderness bilaterally.  Allison Knox has been going for monthly massages which have been helpful but at times Allison Knox still has stiffness in the cervical spine. Patient states that Allison Knox has been experiencing intermittent facial rashes lasting several days at a time.  Allison Knox has been under the care of dermatology and has been using a topical agent as needed.  Patient was unsure if Skyrizi  has caused hypersensitivity or photosensitivity but the rash does not seem worse after administering Skyrizi .   Activities of Daily Living:  Patient reports morning stiffness for 0 minute.   Patient Reports nocturnal pain.  Difficulty dressing/grooming: Denies Difficulty climbing stairs: Denies Difficulty getting out of chair: Denies Difficulty using hands for taps, buttons, cutlery, and/or writing: Denies  Review of Systems  Constitutional:  Negative for fatigue.  HENT:  Negative for mouth sores and mouth dryness.   Eyes:  Negative for dryness.  Respiratory:  Negative for shortness of breath.   Cardiovascular:  Negative for chest pain and palpitations.  Gastrointestinal:  Negative for blood in stool, constipation and diarrhea.  Endocrine: Negative for increased urination.  Genitourinary:   Negative for involuntary urination.  Musculoskeletal:  Positive for myalgias, muscle tenderness and myalgias. Negative for joint pain, gait problem, joint pain, joint swelling, muscle weakness and morning stiffness.  Skin:  Positive for rash. Negative for color change, hair loss and sensitivity to sunlight.  Allergic/Immunologic: Negative for susceptible to infections.  Neurological:  Negative for dizziness and headaches.  Hematological:  Negative for swollen glands.  Psychiatric/Behavioral:  Negative for depressed mood and sleep disturbance. The patient is not nervous/anxious.     PMFS History:  Patient Active Problem List   Diagnosis Date Noted   Psoriatic arthritis (HCC) 06/24/2023   Psoriasis 06/24/2023   Graves disease 06/10/2023   Dyslipidemia 06/10/2023   Arthritis    Diverticula of colon 04/27/2018   Acute cholecystitis 04/26/2018   Hyponatremia 04/26/2018   Cervical dystonia 06/22/2016    Past Medical History:  Diagnosis Date   Abnormal vaginal bleeding    Arthritis    psoriatic arthritis dx April 2021   Asthma    seasonal   Ovarian cyst     Family History  Problem Relation Age of Onset   Gallstones Mother    Heart failure Mother    Dementia Mother    Diabetes Mother    Hypertension Mother    Lung cancer Father    Anxiety disorder Sister    Psoriasis Sister    Hypertension Brother    Gallstones Brother    Rheum arthritis Brother    Healthy  Son    Healthy Daughter    Neuromuscular disorder Neg Hx    Past Surgical History:  Procedure Laterality Date   HAND SURGERY Right    HERNIA REPAIR     as a child   OVARIAN CYST REMOVAL     Social History   Social History Narrative   Lives with husband   Caffeine use: Hot tea (black tea) daily   Coffee sometimes    There is no immunization history on file for this patient.   Objective: Vital Signs: BP 106/71   Pulse 63   Temp 97.7 F (36.5 C)   Resp 14   Ht 5' 6 (1.676 m)   Wt 164 lb 3.2 oz (74.5  kg)   LMP 05/16/2018   BMI 26.50 kg/m    Physical Exam Vitals and nursing note reviewed.  Constitutional:      Appearance: Allison Knox is well-developed.  HENT:     Head: Normocephalic and atraumatic.  Eyes:     Conjunctiva/sclera: Conjunctivae normal.  Cardiovascular:     Rate and Rhythm: Normal rate and regular rhythm.     Heart sounds: Normal heart sounds.  Pulmonary:     Effort: Pulmonary effort is normal.     Breath sounds: Normal breath sounds.  Abdominal:     General: Bowel sounds are normal.     Palpations: Abdomen is soft.  Musculoskeletal:     Cervical back: Normal range of motion.  Lymphadenopathy:     Cervical: No cervical adenopathy.  Skin:    General: Skin is warm and dry.     Capillary Refill: Capillary refill takes less than 2 seconds.  Neurological:     Mental Status: Allison Knox is alert and oriented to person, place, and time.  Psychiatric:        Behavior: Behavior normal.      Musculoskeletal Exam: C-spine, thoracic spine, lumbar spine have good range of motion.  Trapezius muscle tension and tenderness bilaterally.  No midline spinal tenderness.  No SI joint tenderness.  Shoulder joints, elbow joints, wrist joints, MCPs, PIPs, DIPs have good range of motion with no synovitis.  Mild DIP prominence.  Complete fist formation bilaterally.  Hip joints have good range of motion with no groin pain.  Knee joints have good range of motion no warmth or effusion.  Ankle joints have good range of motion no tenderness or joint swelling.  No evidence of Achilles tendinitis or plantar fasciitis.   CDAI Exam: CDAI Score: -- Patient Global: --; Provider Global: -- Swollen: --; Tender: -- Joint Exam 02/15/2024   No joint exam has been documented for this visit   There is currently no information documented on the homunculus. Go to the Rheumatology activity and complete the homunculus joint exam.  Investigation: No additional findings.  Imaging: No results found.  Recent  Labs: Lab Results  Component Value Date   WBC 5.0 01/18/2024   HGB 11.9 01/18/2024   PLT 317 01/18/2024   NA 138 01/18/2024   K 4.6 01/18/2024   CL 103 01/18/2024   CO2 25 01/18/2024   GLUCOSE 83 01/18/2024   BUN 16 01/18/2024   CREATININE 0.80 01/18/2024   BILITOT 0.6 01/18/2024   ALKPHOS 70 04/27/2018   AST 18 01/18/2024   ALT 19 01/18/2024   PROT 7.3 01/18/2024   ALBUMIN 3.7 04/27/2018   CALCIUM 9.3 01/18/2024   GFRAA >60 04/27/2018   QFTBGOLDPLUS NEGATIVE 06/24/2023    Speciality Comments: Humira - worsened psoriasis (caused pustular diffuse  psoriasis) Skyrizi  started 07/08/23 Derm - Rocky Pereyra  Procedures:  No procedures performed Allergies: Penicillins, Codeine, and Humira (2 pen) [adalimumab]     Assessment / Plan:     Visit Diagnoses: Psoriatic arthritis (HCC) - dxd 09/2019 by [place] rheum.  History of recurrent right knee joint effusion, arthritis in the left wrist and left MTPs. on Skyrizi  since July 08, 2023: No synovitis or dactylitis noted on examination today.  No recurrence of right knee joint effusion.  No SI joint tenderness upon palpation.  No evidence of Achilles tendinitis or plantar fasciitis.  Patient remains on Skyrizi  150 mg sq injections once every 12 weeks.  Allison Knox is tolerating Skyrizi  without any injection site reactions.  No recent or recurrent infections.  No medication changes will be made at this time.  Allison Knox was advised to notify us  if Allison Knox develops signs or symptoms of a flare.  Allison Knox will follow-up in the office in 5 months or sooner if needed.  High risk medication use - Skyrizi  150 mg sq injections every 12 weeks--started 07/08/23. CBC and CMP updated on 01/18/24.  Her next lab work will be due in November and every 3 months.   TB gold negative 06/24/23 No recurrent infections.  Discussed the importance of holding skyrizi  if Allison Knox develops signs or symptoms of an infection and to resume once the infection has completely cleared.  Psoriasis: No active  psoriasis at this time.  Allison Knox remains on Skyrizi  as prescribed.   Pain in both hands: No tenderness or synovitis noted.  No signs of dactylitis.  Chronic pain of right knee: No recurrence of pain or effusion.  Pes cavus of both feet  DDD (degenerative disc disease), cervical Allison Knox experiences intermittent stiffness and discomfort in the cervical spine.  Allison Knox has been trapezius muscle tension and tenderness bilaterally.  Allison Knox has been going for monthly massages which have been helpful.:   Chronic right SI joint pain: No SI joint tenderness upon palpation.  No nocturnal pain at this time.  Other medical conditions are listed as follows:  Dyslipidemia  Diverticula of colon  Vitamin D  deficiency  Postmenopausal  Graves disease  Seasonal allergic rhinitis due to other allergic trigger  Family history of psoriatic arthritis-sister  Family history of rheumatoid arthritis-Brother  Orders: No orders of the defined types were placed in this encounter.  No orders of the defined types were placed in this encounter.    Follow-Up Instructions: Return in about 5 months (around 07/17/2024) for Psoriatic arthritis.   Waddell CHRISTELLA Craze, PA-C  Note - This record has been created using Dragon software.  Chart creation errors have been sought, but may not always  have been located. Such creation errors do not reflect on  the standard of medical care.

## 2024-02-08 ENCOUNTER — Ambulatory Visit: Admitting: Physician Assistant

## 2024-02-15 ENCOUNTER — Ambulatory Visit: Attending: Physician Assistant | Admitting: Physician Assistant

## 2024-02-15 ENCOUNTER — Encounter: Payer: Self-pay | Admitting: Physician Assistant

## 2024-02-15 VITALS — BP 106/71 | HR 63 | Temp 97.7°F | Resp 14 | Ht 66.0 in | Wt 164.2 lb

## 2024-02-15 DIAGNOSIS — Q6671 Congenital pes cavus, right foot: Secondary | ICD-10-CM

## 2024-02-15 DIAGNOSIS — Q6672 Congenital pes cavus, left foot: Secondary | ICD-10-CM

## 2024-02-15 DIAGNOSIS — Z780 Asymptomatic menopausal state: Secondary | ICD-10-CM

## 2024-02-15 DIAGNOSIS — M25561 Pain in right knee: Secondary | ICD-10-CM

## 2024-02-15 DIAGNOSIS — L4050 Arthropathic psoriasis, unspecified: Secondary | ICD-10-CM | POA: Diagnosis not present

## 2024-02-15 DIAGNOSIS — E0500 Thyrotoxicosis with diffuse goiter without thyrotoxic crisis or storm: Secondary | ICD-10-CM

## 2024-02-15 DIAGNOSIS — J3089 Other allergic rhinitis: Secondary | ICD-10-CM

## 2024-02-15 DIAGNOSIS — L409 Psoriasis, unspecified: Secondary | ICD-10-CM | POA: Diagnosis not present

## 2024-02-15 DIAGNOSIS — M79641 Pain in right hand: Secondary | ICD-10-CM | POA: Diagnosis not present

## 2024-02-15 DIAGNOSIS — M5030 Other cervical disc degeneration, unspecified cervical region: Secondary | ICD-10-CM

## 2024-02-15 DIAGNOSIS — Z840 Family history of diseases of the skin and subcutaneous tissue: Secondary | ICD-10-CM

## 2024-02-15 DIAGNOSIS — G8929 Other chronic pain: Secondary | ICD-10-CM

## 2024-02-15 DIAGNOSIS — Z79899 Other long term (current) drug therapy: Secondary | ICD-10-CM | POA: Diagnosis not present

## 2024-02-15 DIAGNOSIS — E559 Vitamin D deficiency, unspecified: Secondary | ICD-10-CM

## 2024-02-15 DIAGNOSIS — M533 Sacrococcygeal disorders, not elsewhere classified: Secondary | ICD-10-CM

## 2024-02-15 DIAGNOSIS — E785 Hyperlipidemia, unspecified: Secondary | ICD-10-CM

## 2024-02-15 DIAGNOSIS — M79642 Pain in left hand: Secondary | ICD-10-CM

## 2024-02-15 DIAGNOSIS — Z8261 Family history of arthritis: Secondary | ICD-10-CM

## 2024-02-15 DIAGNOSIS — K5730 Diverticulosis of large intestine without perforation or abscess without bleeding: Secondary | ICD-10-CM

## 2024-02-15 NOTE — Patient Instructions (Signed)
 Standing Labs We placed an order today for your standing lab work.   Please have your standing labs drawn in mid-November and every 3 months  Please have your labs drawn 2 weeks prior to your appointment so that the provider can discuss your lab results at your appointment, if possible.  Please note that you may see your imaging and lab results in MyChart before we have reviewed them. We will contact you once all results are reviewed. Please allow our office up to 72 hours to thoroughly review all of the results before contacting the office for clarification of your results.  WALK-IN LAB HOURS  Monday through Thursday from 8:00 am -12:30 pm and 1:00 pm-4:30 pm and Friday from 8:00 am-12:00 pm.  Patients with office visits requiring labs will be seen before walk-in labs.  You may encounter longer than normal wait times. Please allow additional time. Wait times may be shorter on  Monday and Thursday afternoons.  We do not book appointments for walk-in labs. We appreciate your patience and understanding with our staff.   Labs are drawn by [facility]. Please bring your co-pay at the time of your lab draw.  You may receive a bill from [facility] for your lab work.  Please note if you are on Hydroxychloroquine and and an order has been placed for a Hydroxychloroquine level,  you will need to have it drawn 4 hours or more after your last dose.  If you wish to have your labs drawn at another location, please call the office 24 hours in advance so we can fax the orders.  The office is located at [address], [address], [place], [place] [address]   If you have any questions regarding directions or hours of operation,  please call [phone].   As a reminder, please drink plenty of water prior to coming for your lab work. Thanks!

## 2024-04-04 ENCOUNTER — Other Ambulatory Visit: Payer: Self-pay | Admitting: Rheumatology

## 2024-04-04 DIAGNOSIS — L409 Psoriasis, unspecified: Secondary | ICD-10-CM

## 2024-04-04 DIAGNOSIS — Z79899 Other long term (current) drug therapy: Secondary | ICD-10-CM

## 2024-04-04 DIAGNOSIS — L4050 Arthropathic psoriasis, unspecified: Secondary | ICD-10-CM

## 2024-04-04 NOTE — Telephone Encounter (Signed)
 Last Fill: 01/15/2024  Labs: 01/15/2024 CBC and CMP is stable.   TB Gold: 06/24/2023 negative    Next Visit: 07/20/2024  Last Visit: 02/15/2024  IK:Ednmpjupr arthritis   Current Dose per office note on 02/15/2024: Skyrizi  150 mg sq injections every 12 weeks   Attempted to contact patient and left message to advise patient that she is due for labs this month. Standing lab orders are in place.   Okay to refill Skyrizi ?

## 2024-04-05 ENCOUNTER — Other Ambulatory Visit: Payer: Self-pay

## 2024-04-05 DIAGNOSIS — Z79899 Other long term (current) drug therapy: Secondary | ICD-10-CM | POA: Diagnosis not present

## 2024-04-06 ENCOUNTER — Ambulatory Visit: Payer: Self-pay | Admitting: Rheumatology

## 2024-04-06 LAB — CBC WITH DIFFERENTIAL/PLATELET
Absolute Lymphocytes: 2045 {cells}/uL (ref 850–3900)
Absolute Monocytes: 290 {cells}/uL (ref 200–950)
Basophils Absolute: 30 {cells}/uL (ref 0–200)
Basophils Relative: 0.6 %
Eosinophils Absolute: 40 {cells}/uL (ref 15–500)
Eosinophils Relative: 0.8 %
HCT: 36.9 % (ref 35.0–45.0)
Hemoglobin: 12.1 g/dL (ref 11.7–15.5)
MCH: 29.4 pg (ref 27.0–33.0)
MCHC: 32.8 g/dL (ref 32.0–36.0)
MCV: 89.8 fL (ref 80.0–100.0)
MPV: 9.7 fL (ref 7.5–12.5)
Monocytes Relative: 5.8 %
Neutro Abs: 2595 {cells}/uL (ref 1500–7800)
Neutrophils Relative %: 51.9 %
Platelets: 298 Thousand/uL (ref 140–400)
RBC: 4.11 Million/uL (ref 3.80–5.10)
RDW: 12.2 % (ref 11.0–15.0)
Total Lymphocyte: 40.9 %
WBC: 5 Thousand/uL (ref 3.8–10.8)

## 2024-04-06 LAB — COMPREHENSIVE METABOLIC PANEL WITH GFR
AG Ratio: 1.6 (calc) (ref 1.0–2.5)
ALT: 16 U/L (ref 6–29)
AST: 17 U/L (ref 10–35)
Albumin: 4.6 g/dL (ref 3.6–5.1)
Alkaline phosphatase (APISO): 59 U/L (ref 37–153)
BUN: 19 mg/dL (ref 7–25)
CO2: 27 mmol/L (ref 20–32)
Calcium: 9.4 mg/dL (ref 8.6–10.4)
Chloride: 103 mmol/L (ref 98–110)
Creat: 0.84 mg/dL (ref 0.50–1.03)
Globulin: 2.8 g/dL (ref 1.9–3.7)
Glucose, Bld: 87 mg/dL (ref 65–99)
Potassium: 4.7 mmol/L (ref 3.5–5.3)
Sodium: 137 mmol/L (ref 135–146)
Total Bilirubin: 0.3 mg/dL (ref 0.2–1.2)
Total Protein: 7.4 g/dL (ref 6.1–8.1)
eGFR: 84 mL/min/1.73m2 (ref >=60)

## 2024-05-06 DIAGNOSIS — L821 Other seborrheic keratosis: Secondary | ICD-10-CM | POA: Diagnosis not present

## 2024-05-06 DIAGNOSIS — L814 Other melanin hyperpigmentation: Secondary | ICD-10-CM | POA: Diagnosis not present

## 2024-05-06 DIAGNOSIS — D229 Melanocytic nevi, unspecified: Secondary | ICD-10-CM | POA: Diagnosis not present

## 2024-05-06 DIAGNOSIS — L578 Other skin changes due to chronic exposure to nonionizing radiation: Secondary | ICD-10-CM | POA: Diagnosis not present

## 2024-05-23 ENCOUNTER — Telehealth: Payer: Self-pay

## 2024-05-23 NOTE — Telephone Encounter (Signed)
 Received notification from [employer] regarding a prior authorization for SKYRIZI  SQ. Authorization has been APPROVED from 05/23/2024 to 05/23/2025. Approval letter sent to scan center.  Authorization # [id number]  Sherry Pennant, PharmD, MPH, BCPS, CPP Clinical Pharmacist

## 2024-05-23 NOTE — Telephone Encounter (Signed)
 Submitted a Prior Authorization renewal request to [employer] for SKYRIZI  SQ via CoverMyMeds. Will update once we receive a response.  Key: [id number]

## 2024-06-24 ENCOUNTER — Other Ambulatory Visit: Payer: Self-pay | Admitting: Physician Assistant

## 2024-06-24 DIAGNOSIS — L409 Psoriasis, unspecified: Secondary | ICD-10-CM

## 2024-06-24 DIAGNOSIS — L4050 Arthropathic psoriasis, unspecified: Secondary | ICD-10-CM

## 2024-06-24 DIAGNOSIS — Z79899 Other long term (current) drug therapy: Secondary | ICD-10-CM

## 2024-06-24 NOTE — Telephone Encounter (Signed)
 Last Fill: 04/04/2024  Labs: 04/05/2024  TB Gold: 06/24/2023 Neg    Next Visit: 07/20/2024  Last Visit: 02/15/2024  DX: Psoriatic arthritis   Current Dose per office note 02/15/2024: Skyrizi  150 mg sq injections every 12 weeks   Patient to update labs at upcoming appointment 07/20/2024.  Okay to refill Skyrizi ?

## 2024-07-06 NOTE — Progress Notes (Unsigned)
 "  Office Visit Note  Patient: Allison Knox             Date of Birth: 1970-06-29           MRN: [id number]             PCP: Verena Mems, MD Referring: Verena Mems, MD Visit Date: 07/20/2024 Occupation: Data Unavailable  Subjective:  No chief complaint on file.   History of Present Illness: Allison Knox is a 54 y.o. female ***     Activities of Daily Living:  Patient reports morning stiffness for *** {minute/hour:19697}.   Patient {ACTIONS;DENIES/REPORTS:21021675::Denies} nocturnal pain.  Difficulty dressing/grooming: {ACTIONS;DENIES/REPORTS:21021675::Denies} Difficulty climbing stairs: {ACTIONS;DENIES/REPORTS:21021675::Denies} Difficulty getting out of chair: {ACTIONS;DENIES/REPORTS:21021675::Denies} Difficulty using hands for taps, buttons, cutlery, and/or writing: {ACTIONS;DENIES/REPORTS:21021675::Denies}  No Rheumatology ROS completed.   PMFS History:  Patient Active Problem List   Diagnosis Date Noted   Psoriatic arthritis (HCC) 06/24/2023   Psoriasis 06/24/2023   Allison Knox disease 06/10/2023   Dyslipidemia 06/10/2023   Arthritis    Diverticula of colon 04/27/2018   Acute cholecystitis 04/26/2018   Hyponatremia 04/26/2018   Cervical dystonia 06/22/2016    Past Medical History:  Diagnosis Date   Abnormal vaginal bleeding    Arthritis    psoriatic arthritis dx April 2021   Asthma    seasonal   Ovarian cyst     Family History  Problem Relation Age of Onset   Gallstones Mother    Heart failure Mother    Dementia Mother    Diabetes Mother    Hypertension Mother    Lung cancer Father    Anxiety disorder Sister    Psoriasis Sister    Hypertension Brother    Gallstones Brother    Rheum arthritis Brother    Healthy Son    Healthy Daughter    Neuromuscular disorder Neg Hx    Past Surgical History:  Procedure Laterality Date   HAND SURGERY Right    HERNIA REPAIR     as a child   OVARIAN CYST REMOVAL     Social History[1] Social  History   Social History Narrative   Lives with husband   Caffeine use: Hot tea (black tea) daily   Coffee sometimes      There is no immunization history on file for this patient.   Objective: Vital Signs: LMP 05/16/2018    Physical Exam   Musculoskeletal Exam: ***  CDAI Exam: CDAI Score: -- Patient Global: --; Provider Global: -- Swollen: --; Tender: -- Joint Exam 07/20/2024   No joint exam has been documented for this visit   There is currently no information documented on the homunculus. Go to the Rheumatology activity and complete the homunculus joint exam.  Investigation: No additional findings.  Imaging: No results found.  Recent Labs: Lab Results  Component Value Date   WBC 5.0 04/05/2024   HGB 12.1 04/05/2024   PLT 298 04/05/2024   NA 137 04/05/2024   K 4.7 04/05/2024   CL 103 04/05/2024   CO2 27 04/05/2024   GLUCOSE 87 04/05/2024   BUN 19 04/05/2024   CREATININE 0.84 04/05/2024   BILITOT 0.3 04/05/2024   ALKPHOS 70 04/27/2018   AST 17 04/05/2024   ALT 16 04/05/2024   PROT 7.4 04/05/2024   ALBUMIN 3.7 04/27/2018   CALCIUM 9.4 04/05/2024   GFRAA >60 04/27/2018   QFTBGOLDPLUS NEGATIVE 06/24/2023    Speciality Comments: Humira - worsened psoriasis (caused pustular diffuse psoriasis) Skyrizi  started 07/08/23 Derm - [facility]  Procedures:  No procedures performed Allergies: Penicillins, Codeine, and Humira (2 pen) [adalimumab]   Assessment / Plan:     Visit Diagnoses: No diagnosis found.  Orders: No orders of the defined types were placed in this encounter.  No orders of the defined types were placed in this encounter.   Face-to-face time spent with patient was *** minutes. Greater than 50% of time was spent in counseling and coordination of care.  Follow-Up Instructions: No follow-ups on file.   Daved JAYSON Gavel, CMA  Note - This record has been created using Animal nutritionist.  Chart creation errors have been sought, but may not  always  have been located. Such creation errors do not reflect on  the standard of medical care.    [1]  Social History Tobacco Use   Smoking status: Former    Passive exposure: Never   Smokeless tobacco: Never  Vaping Use   Vaping status: Never Used  Substance Use Topics   Alcohol use: Yes    Comment: occ   Drug use: No   "

## 2024-07-20 ENCOUNTER — Ambulatory Visit: Admitting: Rheumatology

## 2024-07-20 DIAGNOSIS — Z780 Asymptomatic menopausal state: Secondary | ICD-10-CM

## 2024-07-20 DIAGNOSIS — E0500 Thyrotoxicosis with diffuse goiter without thyrotoxic crisis or storm: Secondary | ICD-10-CM

## 2024-07-20 DIAGNOSIS — Q6672 Congenital pes cavus, left foot: Secondary | ICD-10-CM

## 2024-07-20 DIAGNOSIS — J3089 Other allergic rhinitis: Secondary | ICD-10-CM

## 2024-07-20 DIAGNOSIS — K5730 Diverticulosis of large intestine without perforation or abscess without bleeding: Secondary | ICD-10-CM

## 2024-07-20 DIAGNOSIS — G8929 Other chronic pain: Secondary | ICD-10-CM

## 2024-07-20 DIAGNOSIS — L409 Psoriasis, unspecified: Secondary | ICD-10-CM

## 2024-07-20 DIAGNOSIS — M79641 Pain in right hand: Secondary | ICD-10-CM

## 2024-07-20 DIAGNOSIS — Z79899 Other long term (current) drug therapy: Secondary | ICD-10-CM

## 2024-07-20 DIAGNOSIS — M5030 Other cervical disc degeneration, unspecified cervical region: Secondary | ICD-10-CM

## 2024-07-20 DIAGNOSIS — Z840 Family history of diseases of the skin and subcutaneous tissue: Secondary | ICD-10-CM

## 2024-07-20 DIAGNOSIS — Z8261 Family history of arthritis: Secondary | ICD-10-CM

## 2024-07-20 DIAGNOSIS — E559 Vitamin D deficiency, unspecified: Secondary | ICD-10-CM

## 2024-07-20 DIAGNOSIS — L4050 Arthropathic psoriasis, unspecified: Secondary | ICD-10-CM

## 2024-07-20 DIAGNOSIS — E785 Hyperlipidemia, unspecified: Secondary | ICD-10-CM
